# Patient Record
Sex: Female | Born: 1965
Health system: Southern US, Community
[De-identification: ages and names within clinical notes are randomized; demographics above are authoritative.]

## PROBLEM LIST (undated history)

## (undated) DIAGNOSIS — R112 Nausea with vomiting, unspecified: Secondary | ICD-10-CM

## (undated) DIAGNOSIS — F419 Anxiety disorder, unspecified: Secondary | ICD-10-CM

## (undated) DIAGNOSIS — Z9889 Other specified postprocedural states: Secondary | ICD-10-CM

## (undated) DIAGNOSIS — N62 Hypertrophy of breast: Secondary | ICD-10-CM

## (undated) DIAGNOSIS — R51 Headache: Secondary | ICD-10-CM

## (undated) DIAGNOSIS — R87629 Unspecified abnormal cytological findings in specimens from vagina: Secondary | ICD-10-CM

## (undated) DIAGNOSIS — R0981 Nasal congestion: Secondary | ICD-10-CM

## (undated) DIAGNOSIS — J302 Other seasonal allergic rhinitis: Secondary | ICD-10-CM

## (undated) DIAGNOSIS — Z98811 Dental restoration status: Secondary | ICD-10-CM

## (undated) HISTORY — PX: APPENDECTOMY: SHX54

## (undated) HISTORY — DX: Unspecified abnormal cytological findings in specimens from vagina: R87.629

## (undated) HISTORY — DX: Other seasonal allergic rhinitis: J30.2

## (undated) HISTORY — PX: CHOLECYSTECTOMY: SHX55

## (undated) HISTORY — PX: ABDOMINAL HYSTERECTOMY: SHX81

## (undated) HISTORY — PX: REDUCTION MAMMAPLASTY: SUR839

---

## 2003-08-06 ENCOUNTER — Ambulatory Visit (HOSPITAL_COMMUNITY): Admission: RE | Admit: 2003-08-06 | Discharge: 2003-08-06 | Payer: Self-pay | Admitting: Family Medicine

## 2003-12-19 ENCOUNTER — Ambulatory Visit (HOSPITAL_COMMUNITY): Admission: RE | Admit: 2003-12-19 | Discharge: 2003-12-19 | Payer: Self-pay | Admitting: *Deleted

## 2004-01-08 ENCOUNTER — Encounter (HOSPITAL_COMMUNITY): Admission: RE | Admit: 2004-01-08 | Discharge: 2004-02-07 | Payer: Self-pay | Admitting: Family Medicine

## 2004-01-12 ENCOUNTER — Encounter (HOSPITAL_COMMUNITY): Admission: RE | Admit: 2004-01-12 | Discharge: 2004-02-11 | Payer: Self-pay | Admitting: Family Medicine

## 2004-02-12 ENCOUNTER — Encounter (HOSPITAL_COMMUNITY): Admission: RE | Admit: 2004-02-12 | Discharge: 2004-03-13 | Payer: Self-pay | Admitting: Family Medicine

## 2004-03-16 ENCOUNTER — Encounter (HOSPITAL_COMMUNITY): Admission: RE | Admit: 2004-03-16 | Discharge: 2004-04-09 | Payer: Self-pay | Admitting: Family Medicine

## 2005-06-09 ENCOUNTER — Ambulatory Visit (HOSPITAL_COMMUNITY): Admission: RE | Admit: 2005-06-09 | Discharge: 2005-06-09 | Payer: Self-pay | Admitting: Family Medicine

## 2005-12-21 ENCOUNTER — Ambulatory Visit (HOSPITAL_COMMUNITY): Admission: RE | Admit: 2005-12-21 | Discharge: 2005-12-21 | Payer: Self-pay | Admitting: Family Medicine

## 2006-03-01 ENCOUNTER — Ambulatory Visit (HOSPITAL_COMMUNITY): Admission: RE | Admit: 2006-03-01 | Discharge: 2006-03-01 | Payer: Self-pay | Admitting: Family Medicine

## 2006-05-05 ENCOUNTER — Encounter: Admission: RE | Admit: 2006-05-05 | Discharge: 2006-05-05 | Payer: Self-pay | Admitting: Neurosurgery

## 2010-04-13 ENCOUNTER — Ambulatory Visit (HOSPITAL_COMMUNITY)
Admission: RE | Admit: 2010-04-13 | Discharge: 2010-04-13 | Payer: Self-pay | Source: Home / Self Care | Attending: Family Medicine | Admitting: Family Medicine

## 2010-04-21 ENCOUNTER — Ambulatory Visit (HOSPITAL_COMMUNITY)
Admission: RE | Admit: 2010-04-21 | Discharge: 2010-04-21 | Payer: Self-pay | Source: Home / Self Care | Attending: Family Medicine | Admitting: Family Medicine

## 2010-05-11 ENCOUNTER — Other Ambulatory Visit (HOSPITAL_COMMUNITY): Payer: Self-pay | Admitting: Family Medicine

## 2010-05-11 DIAGNOSIS — N852 Hypertrophy of uterus: Secondary | ICD-10-CM

## 2010-05-13 ENCOUNTER — Other Ambulatory Visit (HOSPITAL_COMMUNITY): Payer: Self-pay | Admitting: Family Medicine

## 2010-05-13 ENCOUNTER — Ambulatory Visit (HOSPITAL_COMMUNITY)
Admission: RE | Admit: 2010-05-13 | Discharge: 2010-05-13 | Disposition: A | Discharge status: Home / Self Care | Payer: BC Managed Care – PPO | Source: Ambulatory Visit | Attending: Family Medicine | Admitting: Family Medicine

## 2010-05-13 DIAGNOSIS — R9389 Abnormal findings on diagnostic imaging of other specified body structures: Secondary | ICD-10-CM | POA: Insufficient documentation

## 2010-05-13 DIAGNOSIS — Z09 Encounter for follow-up examination after completed treatment for conditions other than malignant neoplasm: Secondary | ICD-10-CM

## 2010-05-13 DIAGNOSIS — R109 Unspecified abdominal pain: Secondary | ICD-10-CM | POA: Insufficient documentation

## 2010-05-13 DIAGNOSIS — N852 Hypertrophy of uterus: Secondary | ICD-10-CM

## 2010-10-20 ENCOUNTER — Ambulatory Visit (HOSPITAL_COMMUNITY): Payer: BC Managed Care – PPO

## 2011-05-04 ENCOUNTER — Ambulatory Visit (HOSPITAL_COMMUNITY)
Admission: RE | Admit: 2011-05-04 | Discharge: 2011-05-04 | Disposition: A | Discharge status: Home / Self Care | Payer: Managed Care, Other (non HMO) | Source: Ambulatory Visit | Attending: Family Medicine | Admitting: Family Medicine

## 2011-05-04 DIAGNOSIS — Z09 Encounter for follow-up examination after completed treatment for conditions other than malignant neoplasm: Secondary | ICD-10-CM

## 2011-05-04 DIAGNOSIS — R928 Other abnormal and inconclusive findings on diagnostic imaging of breast: Secondary | ICD-10-CM | POA: Insufficient documentation

## 2012-03-11 DIAGNOSIS — N62 Hypertrophy of breast: Secondary | ICD-10-CM

## 2012-03-11 HISTORY — DX: Hypertrophy of breast: N62

## 2012-04-02 ENCOUNTER — Encounter (HOSPITAL_BASED_OUTPATIENT_CLINIC_OR_DEPARTMENT_OTHER): Payer: Self-pay | Admitting: *Deleted

## 2012-04-02 DIAGNOSIS — R0981 Nasal congestion: Secondary | ICD-10-CM

## 2012-04-02 HISTORY — DX: Nasal congestion: R09.81

## 2012-04-09 ENCOUNTER — Encounter (HOSPITAL_BASED_OUTPATIENT_CLINIC_OR_DEPARTMENT_OTHER): Payer: Self-pay | Admitting: *Deleted

## 2012-04-09 ENCOUNTER — Encounter (HOSPITAL_BASED_OUTPATIENT_CLINIC_OR_DEPARTMENT_OTHER): Payer: Self-pay | Admitting: Anesthesiology

## 2012-04-09 ENCOUNTER — Ambulatory Visit (HOSPITAL_BASED_OUTPATIENT_CLINIC_OR_DEPARTMENT_OTHER): Payer: Managed Care, Other (non HMO) | Admitting: Anesthesiology

## 2012-04-09 ENCOUNTER — Ambulatory Visit (HOSPITAL_BASED_OUTPATIENT_CLINIC_OR_DEPARTMENT_OTHER)
Admission: RE | Admit: 2012-04-09 | Discharge: 2012-04-10 | Disposition: A | Discharge status: Home / Self Care | Payer: Managed Care, Other (non HMO) | Source: Ambulatory Visit | Attending: Specialist | Admitting: Specialist

## 2012-04-09 ENCOUNTER — Encounter (HOSPITAL_BASED_OUTPATIENT_CLINIC_OR_DEPARTMENT_OTHER)
Admission: RE | Disposition: A | Discharge status: Home / Self Care | Payer: Self-pay | Source: Ambulatory Visit | Attending: Specialist

## 2012-04-09 DIAGNOSIS — F411 Generalized anxiety disorder: Secondary | ICD-10-CM | POA: Insufficient documentation

## 2012-04-09 DIAGNOSIS — N62 Hypertrophy of breast: Secondary | ICD-10-CM | POA: Insufficient documentation

## 2012-04-09 DIAGNOSIS — Z9089 Acquired absence of other organs: Secondary | ICD-10-CM | POA: Insufficient documentation

## 2012-04-09 HISTORY — DX: Nasal congestion: R09.81

## 2012-04-09 HISTORY — DX: Anxiety disorder, unspecified: F41.9

## 2012-04-09 HISTORY — DX: Headache: R51

## 2012-04-09 HISTORY — DX: Nausea with vomiting, unspecified: Z98.890

## 2012-04-09 HISTORY — DX: Hypertrophy of breast: N62

## 2012-04-09 HISTORY — DX: Other specified postprocedural states: R11.2

## 2012-04-09 HISTORY — DX: Dental restoration status: Z98.811

## 2012-04-09 HISTORY — PX: BREAST REDUCTION SURGERY: SHX8

## 2012-04-09 SURGERY — MAMMOPLASTY, REDUCTION
Anesthesia: General | Site: Breast | Laterality: Bilateral | Wound class: Clean

## 2012-04-09 MED ORDER — OXYCODONE HCL 5 MG PO TABS: 5.0000 mg | ORAL_TABLET | Freq: Once | ORAL | Status: AC | PRN

## 2012-04-09 MED ORDER — PROMETHAZINE HCL 25 MG/ML IJ SOLN: 6.2500 mg | INTRAMUSCULAR | Status: DC | PRN

## 2012-04-09 MED ORDER — DIPHENHYDRAMINE HCL 50 MG/ML IJ SOLN: 12.5000 mg | Freq: Four times a day (QID) | INTRAMUSCULAR | Status: DC | PRN

## 2012-04-09 MED ORDER — PROPOFOL 10 MG/ML IV BOLUS
INTRAVENOUS | Status: DC | PRN
  Administered 2012-04-09: 200 mg via INTRAVENOUS

## 2012-04-09 MED ORDER — LACTATED RINGERS IV SOLN
INTRAVENOUS | Status: DC
  Administered 2012-04-09 (×2): via INTRAVENOUS

## 2012-04-09 MED ORDER — LIDOCAINE-EPINEPHRINE 0.5 %-1:200000 IJ SOLN
INTRAMUSCULAR | Status: DC | PRN
  Administered 2012-04-09: 50 mL

## 2012-04-09 MED ORDER — HYDROCODONE-ACETAMINOPHEN 5-325 MG PO TABS
1.0000 | ORAL_TABLET | ORAL | Status: DC | PRN
  Administered 2012-04-09 – 2012-04-10 (×4): 2 via ORAL

## 2012-04-09 MED ORDER — MORPHINE SULFATE (PF) 1 MG/ML IV SOLN
INTRAVENOUS | Status: DC
  Administered 2012-04-09: 1 mg via INTRAVENOUS

## 2012-04-09 MED ORDER — SUCCINYLCHOLINE CHLORIDE 20 MG/ML IJ SOLN
INTRAMUSCULAR | Status: DC | PRN
  Administered 2012-04-09: 100 mg via INTRAVENOUS

## 2012-04-09 MED ORDER — CEFAZOLIN SODIUM 1-5 GM-% IV SOLN
1.0000 g | Freq: Three times a day (TID) | INTRAVENOUS | Status: DC
  Administered 2012-04-09 (×2): 1 g via INTRAVENOUS

## 2012-04-09 MED ORDER — MEPERIDINE HCL 25 MG/ML IJ SOLN: 6.2500 mg | INTRAMUSCULAR | Status: DC | PRN

## 2012-04-09 MED ORDER — FENTANYL CITRATE 0.05 MG/ML IJ SOLN
INTRAMUSCULAR | Status: DC | PRN
  Administered 2012-04-09: 25 ug via INTRAVENOUS
  Administered 2012-04-09: 100 ug via INTRAVENOUS

## 2012-04-09 MED ORDER — CEFAZOLIN SODIUM-DEXTROSE 2-3 GM-% IV SOLR
2.0000 g | INTRAVENOUS | Status: AC
  Administered 2012-04-09: 2 g via INTRAVENOUS

## 2012-04-09 MED ORDER — ONDANSETRON HCL 4 MG/2ML IJ SOLN: 4.0000 mg | Freq: Four times a day (QID) | INTRAMUSCULAR | Status: DC | PRN

## 2012-04-09 MED ORDER — FENTANYL CITRATE 0.05 MG/ML IJ SOLN: 50.0000 ug | INTRAMUSCULAR | Status: DC | PRN

## 2012-04-09 MED ORDER — SODIUM CHLORIDE 0.9 % IJ SOLN: 9.0000 mL | INTRAMUSCULAR | Status: DC | PRN

## 2012-04-09 MED ORDER — DIPHENHYDRAMINE HCL 12.5 MG/5ML PO ELIX: 12.5000 mg | ORAL_SOLUTION | Freq: Four times a day (QID) | ORAL | Status: DC | PRN

## 2012-04-09 MED ORDER — MORPHINE SULFATE 2 MG/ML IJ SOLN
2.0000 mg | INTRAMUSCULAR | Status: DC | PRN
  Administered 2012-04-09: 2 mg via INTRAVENOUS

## 2012-04-09 MED ORDER — LIDOCAINE-EPINEPHRINE 0.5 %-1:200000 IJ SOLN
INTRAMUSCULAR | Status: DC | PRN
  Administered 2012-04-09: 250 mL
  Administered 2012-04-09: 50 mL

## 2012-04-09 MED ORDER — ONDANSETRON HCL 4 MG PO TABS: 4.0000 mg | ORAL_TABLET | Freq: Four times a day (QID) | ORAL | Status: DC | PRN

## 2012-04-09 MED ORDER — MIDAZOLAM HCL 5 MG/5ML IJ SOLN
INTRAMUSCULAR | Status: DC | PRN
  Administered 2012-04-09: 2 mg via INTRAVENOUS

## 2012-04-09 MED ORDER — MIDAZOLAM HCL 2 MG/2ML IJ SOLN: 1.0000 mg | INTRAMUSCULAR | Status: DC | PRN

## 2012-04-09 MED ORDER — DEXTROSE IN LACTATED RINGERS 5 % IV SOLN
INTRAVENOUS | Status: DC
  Administered 2012-04-09: 125 mL/h via INTRAVENOUS
  Administered 2012-04-09 – 2012-04-10 (×2): via INTRAVENOUS

## 2012-04-09 MED ORDER — HYDROMORPHONE HCL PF 1 MG/ML IJ SOLN
0.2500 mg | INTRAMUSCULAR | Status: DC | PRN
  Administered 2012-04-09 (×3): 0.5 mg via INTRAVENOUS

## 2012-04-09 MED ORDER — ONDANSETRON HCL 4 MG/2ML IJ SOLN
INTRAMUSCULAR | Status: DC | PRN
  Administered 2012-04-09: 4 mg via INTRAVENOUS

## 2012-04-09 MED ORDER — SODIUM CHLORIDE 0.9 % IV SOLN
INTRAVENOUS | Status: DC | PRN
  Administered 2012-04-09: 250 mL via INTRAMUSCULAR

## 2012-04-09 MED ORDER — OXYCODONE HCL 5 MG/5ML PO SOLN: 5.0000 mg | Freq: Once | ORAL | Status: AC | PRN

## 2012-04-09 MED ORDER — LIDOCAINE HCL (CARDIAC) 20 MG/ML IV SOLN
INTRAVENOUS | Status: DC | PRN
  Administered 2012-04-09: 50 mg via INTRAVENOUS

## 2012-04-09 MED ORDER — MIDAZOLAM HCL 2 MG/2ML IJ SOLN: 0.5000 mg | Freq: Once | INTRAMUSCULAR | Status: AC | PRN

## 2012-04-09 MED ORDER — DEXAMETHASONE SODIUM PHOSPHATE 4 MG/ML IJ SOLN
INTRAMUSCULAR | Status: DC | PRN
  Administered 2012-04-09: 10 mg via INTRAVENOUS

## 2012-04-09 MED ORDER — NALOXONE HCL 0.4 MG/ML IJ SOLN: 0.4000 mg | INTRAMUSCULAR | Status: DC | PRN

## 2012-04-09 SURGICAL SUPPLY — 56 items
BAG DECANTER FOR FLEXI CONT (MISCELLANEOUS) ×2 IMPLANT
BENZOIN TINCTURE PRP APPL 2/3 (GAUZE/BANDAGES/DRESSINGS) ×4 IMPLANT
BLADE KNIFE  20 PERSONNA (BLADE) ×2
BLADE KNIFE 20 PERSONNA (BLADE) ×2 IMPLANT
BLADE KNIFE PERSONA 15 (BLADE) ×2 IMPLANT
CANISTER SUCTION 1200CC (MISCELLANEOUS) ×2 IMPLANT
CLOTH BEACON ORANGE TIMEOUT ST (SAFETY) ×2 IMPLANT
COVER MAYO STAND STRL (DRAPES) ×2 IMPLANT
COVER TABLE BACK 60X90 (DRAPES) ×2 IMPLANT
DECANTER SPIKE VIAL GLASS SM (MISCELLANEOUS) ×4 IMPLANT
DRAIN CHANNEL 10F 3/8 F FF (DRAIN) ×4 IMPLANT
DRAPE LAPAROSCOPIC ABDOMINAL (DRAPES) ×2 IMPLANT
DRAPE UTILITY XL STRL (DRAPES) ×2 IMPLANT
DRSG PAD ABDOMINAL 8X10 ST (GAUZE/BANDAGES/DRESSINGS) ×8 IMPLANT
ELECT REM PT RETURN 9FT ADLT (ELECTROSURGICAL) ×2
ELECTRODE REM PT RTRN 9FT ADLT (ELECTROSURGICAL) ×1 IMPLANT
EVACUATOR SILICONE 100CC (DRAIN) ×4 IMPLANT
FILTER 7/8 IN (FILTER) ×2 IMPLANT
GAUZE XEROFORM 5X9 LF (GAUZE/BANDAGES/DRESSINGS) ×4 IMPLANT
GLOVE BIO SURGEON STRL SZ 6.5 (GLOVE) ×2 IMPLANT
GLOVE BIOGEL M STRL SZ7.5 (GLOVE) ×2 IMPLANT
GLOVE BIOGEL PI IND STRL 8 (GLOVE) ×1 IMPLANT
GLOVE BIOGEL PI INDICATOR 8 (GLOVE) ×1
GLOVE ECLIPSE 7.0 STRL STRAW (GLOVE) ×2 IMPLANT
GOWN PREVENTION PLUS XXLARGE (GOWN DISPOSABLE) ×4 IMPLANT
IV NS 500ML (IV SOLUTION) ×1
IV NS 500ML BAXH (IV SOLUTION) ×1 IMPLANT
NEEDLE SPNL 18GX3.5 QUINCKE PK (NEEDLE) ×2 IMPLANT
NS IRRIG 1000ML POUR BTL (IV SOLUTION) ×2 IMPLANT
PACK BASIN DAY SURGERY FS (CUSTOM PROCEDURE TRAY) ×2 IMPLANT
PEN SKIN MARKING BROAD TIP (MISCELLANEOUS) ×2 IMPLANT
PILLOW FOAM RUBBER ADULT (PILLOWS) ×2 IMPLANT
PIN SAFETY STERILE (MISCELLANEOUS) ×2 IMPLANT
SHEETING SILICONE GEL EPI DERM (MISCELLANEOUS) IMPLANT
SLEEVE SCD COMPRESS KNEE MED (MISCELLANEOUS) ×2 IMPLANT
SPECIMEN JAR MEDIUM (MISCELLANEOUS) IMPLANT
SPECIMEN JAR X LARGE (MISCELLANEOUS) ×4 IMPLANT
SPONGE GAUZE 4X4 12PLY (GAUZE/BANDAGES/DRESSINGS) ×4 IMPLANT
SPONGE LAP 18X18 X RAY DECT (DISPOSABLE) ×8 IMPLANT
STRIP SUTURE WOUND CLOSURE 1/2 (SUTURE) ×10 IMPLANT
SUT MNCRL AB 3-0 PS2 18 (SUTURE) ×12 IMPLANT
SUT MON AB 2-0 CT1 36 (SUTURE) IMPLANT
SUT MON AB 5-0 PS2 18 (SUTURE) ×4 IMPLANT
SUT PROLENE 3 0 PS 2 (SUTURE) ×12 IMPLANT
SYR 50ML LL SCALE MARK (SYRINGE) ×4 IMPLANT
SYR CONTROL 10ML LL (SYRINGE) IMPLANT
TAPE HYPAFIX 6X30 (GAUZE/BANDAGES/DRESSINGS) ×2 IMPLANT
TAPE MEASURE 72IN RETRACT (INSTRUMENTS) ×1
TAPE MEASURE LINEN 72IN RETRCT (INSTRUMENTS) ×1 IMPLANT
TAPE PAPER MEDFIX 1IN X 10YD (GAUZE/BANDAGES/DRESSINGS) ×2 IMPLANT
TOWEL OR NON WOVEN STRL DISP B (DISPOSABLE) ×2 IMPLANT
TUBE CONNECTING 20X1/4 (TUBING) ×2 IMPLANT
UNDERPAD 30X30 INCONTINENT (UNDERPADS AND DIAPERS) ×6 IMPLANT
VAC PENCILS W/TUBING CLEAR (MISCELLANEOUS) ×2 IMPLANT
WATER STERILE IRR 1000ML POUR (IV SOLUTION) IMPLANT
YANKAUER SUCT BULB TIP NO VENT (SUCTIONS) ×2 IMPLANT

## 2012-04-09 NOTE — Anesthesia Preprocedure Evaluation (Signed)
Anesthesia Evaluation  Patient identified by MRN, date of birth, ID band Patient awake    Reviewed: Allergy & Precautions, H&P , NPO status , Patient's Chart, lab work & pertinent test results  History of Anesthesia Complications Negative for: history of anesthetic complications  Airway Mallampati: I TM Distance: >3 FB Neck ROM: Full    Dental No notable dental hx. (+) Teeth Intact and Dental Advisory Given   Pulmonary neg pulmonary ROS,  breath sounds clear to auscultation  Pulmonary exam normal       Cardiovascular negative cardio ROS  Rhythm:Regular Rate:Normal     Neuro/Psych negative neurological ROS     GI/Hepatic negative GI ROS, Neg liver ROS,   Endo/Other  negative endocrine ROS  Renal/GU negative Renal ROS     Musculoskeletal   Abdominal   Peds  Hematology negative hematology ROS (+)   Anesthesia Other Findings   Reproductive/Obstetrics LMP 2-3 weeks ago                           Anesthesia Physical Anesthesia Plan  ASA: I  Anesthesia Plan: General   Post-op Pain Management:    Induction: Intravenous  Airway Management Planned: Oral ETT  Additional Equipment:   Intra-op Plan:   Post-operative Plan: Extubation in OR  Informed Consent: I have reviewed the patients History and Physical, chart, labs and discussed the procedure including the risks, benefits and alternatives for the proposed anesthesia with the patient or authorized representative who has indicated his/her understanding and acceptance.   Dental advisory given  Plan Discussed with: CRNA and Surgeon  Anesthesia Plan Comments: (Plan routine monitors, GETA)        Anesthesia Quick Evaluation

## 2012-04-09 NOTE — Transfer of Care (Signed)
Immediate Anesthesia Transfer of Care Note  Patient: Rebekah Andrews  Procedure(s) Performed: Procedure(s) (LRB) with comments: MAMMARY REDUCTION  (BREAST) (Bilateral)  Patient Location: PACU  Anesthesia Type:General  Level of Consciousness: awake and alert   Airway & Oxygen Therapy: Patient Spontanous Breathing and Patient connected to face mask oxygen  Post-op Assessment: Report given to PACU RN and Post -op Vital signs reviewed and stable  Post vital signs: Reviewed and stable  Complications: No apparent anesthesia complications

## 2012-04-09 NOTE — H&P (Signed)
Rebekah Andrews is an 46 y.o. female.   Chief Complaint: Increased macromastia HPI: history of back and shoulder pain intertrigo  Past Medical History  Diagnosis Date  . PONV (postoperative nausea and vomiting)   . Headache     due to pressure from macromastia  . Anxiety   . Sinus congestion 04/02/2012  . Dental crown present   . Breast hypertrophy 03/2012    Past Surgical History  Procedure Date  . Cesarean section     x 2  . Cholecystectomy   . Appendectomy     History reviewed. No pertinent family history. Social History:  reports that she has never smoked. She has never used smokeless tobacco. She reports that she drinks alcohol. She reports that she does not use illicit drugs.  Allergies: No Known Allergies  Medications Prior to Admission  Medication Sig Dispense Refill  . buPROPion (WELLBUTRIN) 75 MG tablet Take 75 mg by mouth 2 (two) times daily.      . clonazePAM (KLONOPIN) 0.5 MG tablet Take 0.5 mg by mouth 2 (two) times daily as needed.      . zolpidem (AMBIEN CR) 6.25 MG CR tablet Take 6.25 mg by mouth at bedtime as needed.        Results for orders placed during the hospital encounter of 04/09/12 (from the past 48 hour(s))  POCT HEMOGLOBIN-HEMACUE     Status: Abnormal   Collection Time   04/09/12  7:19 AM      Component Value Range Comment   Hemoglobin 11.7 (*) 12.0 - 15.0 g/dL    No results found.  Review of Systems  Constitutional: Negative.   HENT: Negative.   Eyes: Negative.   Respiratory: Negative.   Cardiovascular: Negative.   Gastrointestinal: Negative.   Genitourinary: Negative.   Musculoskeletal: Negative.   Skin: Positive for rash.  Neurological: Negative.   Endo/Heme/Allergies: Negative.   Psychiatric/Behavioral: Negative.     Blood pressure 103/68, pulse 63, temperature 98.3 F (36.8 C), temperature source Oral, resp. rate 16, height 5\' 7"  (1.702 m), weight 57.607 kg (127 lb), last menstrual period 03/22/2012, SpO2 100.00%. Physical  Exam   Assessment/Plan Macromastia for bilateral breast reductions  Chalsey Leeth L 04/09/2012, 7:33 AM

## 2012-04-09 NOTE — Brief Op Note (Signed)
04/09/2012  9:52 AM  PATIENT:  Rebekah Andrews  46 y.o. female  PRE-OPERATIVE DIAGNOSIS:  MACROMASTIA  POST-OPERATIVE DIAGNOSIS:  MACROMASTIA  PROCEDURE:  Procedure(s) (LRB) with comments: MAMMARY REDUCTION  (BREAST) (Bilateral)  SURGEON:  Surgeon(s) and Role:    * Louisa Second, MD - Primary  PHYSICIAN ASSISTANT:   ASSISTANTS: none   ANESTHESIA:   general  EBL:  Total I/O In: 1500 [I.V.:1500] Out: -   BLOOD ADMINISTERED:none  DRAINS: (right and left chest areas) Jackson-Pratt drain(s) with closed bulb suction in the right and left chest areas   LOCAL MEDICATIONS USED:  XYLOCAINE   SPECIMEN:  Excision  DISPOSITION OF SPECIMEN:  PATHOLOGY  COUNTS:  YES  TOURNIQUET:  * No tourniquets in log *  DICTATION: .Other Dictation: Dictation Number [id number]  PLAN OF CARE: Admit for overnight observation  PATIENT DISPOSITION:  PACU - hemodynamically stable.   Delay start of Pharmacological VTE agent (>24hrs) due to surgical blood loss or risk of bleeding: yes

## 2012-04-09 NOTE — Anesthesia Postprocedure Evaluation (Signed)
  Anesthesia Post-op Note  Patient: Rebekah Andrews  Procedure(s) Performed: Procedure(s) (LRB) with comments: MAMMARY REDUCTION  (BREAST) (Bilateral)  Patient Location: PACU  Anesthesia Type:General  Level of Consciousness: awake, alert , oriented and patient cooperative  Airway and Oxygen Therapy: Patient Spontanous Breathing  Post-op Pain: none  Post-op Assessment: Post-op Vital signs reviewed, Patient's Cardiovascular Status Stable, Respiratory Function Stable, Patent Airway, No signs of Nausea or vomiting and Pain level controlled  Post-op Vital Signs: Reviewed and stable  Complications: No apparent anesthesia complications

## 2012-04-09 NOTE — Anesthesia Procedure Notes (Signed)
Procedure Name: Intubation Date/Time: 04/09/2012 7:53 AM Performed by: Caren Macadam Pre-anesthesia Checklist: Patient identified, Emergency Drugs available, Suction available and Patient being monitored Patient Re-evaluated:Patient Re-evaluated prior to inductionOxygen Delivery Method: Circle System Utilized Preoxygenation: Pre-oxygenation with 100% oxygen Intubation Type: IV induction Ventilation: Mask ventilation without difficulty Laryngoscope Size: Miller and 2 Grade View: Grade I Tube type: Oral Tube size: 7.0 mm Number of attempts: 1 Airway Equipment and Method: stylet and oral airway Placement Confirmation: ETT inserted through vocal cords under direct vision,  positive ETCO2 and breath sounds checked- equal and bilateral Secured at: 19 cm Tube secured with: Tape Dental Injury: Teeth and Oropharynx as per pre-operative assessment

## 2012-04-10 ENCOUNTER — Encounter (HOSPITAL_BASED_OUTPATIENT_CLINIC_OR_DEPARTMENT_OTHER): Payer: Self-pay | Admitting: Specialist

## 2012-04-10 NOTE — Op Note (Signed)
[name], [name]               ACCOUNT NO.:  [id number]  MEDICAL RECORD NO.:  [id number]  LOCATION:                                 FACILITY:  PHYSICIAN:  Earvin Hansen L. Julion Gatt, M.D.DATE OF BIRTH:  1965/08/25  DATE OF PROCEDURE:  04/09/2012 DATE OF DISCHARGE:                              OPERATIVE REPORT   A 46 year old lady with severe macromastia, back and shoulder pain secondary to large pendulous breasts, has various small frame chest area with increased pendulous heavy breasts resistant to conservative treatment.  PROCEDURES PLANNED:  Bilateral breast reductions using the inferior pedicle technique.  ANESTHESIA:  General.  Preoperatively, the patient was self indulgent for the reduction mammoplasty, remarking the nipple-areolar complexes up to 21 cm from the suprasternal notch.  She underwent general anesthesia, intubated orally without difficulty.  Prep was done to the chest, breast areas in routine fashion using Hibiclens soap and solution.  Sterile towels and drapes were placed to make a sterile field.  The skin incisions and the breast tissues were injected with local anesthesia, 100,000 to 400,000 concentration, total of 200 mL per side.  Xylocaine with epinephrine. The wounds were scored with #15-blade and the skin of the inferior pedicle was de-epithelialized with #20 blade.  Medial and lateral pedicles were excised down the fascia.  New keyhole area was also debulked.  After proper hemostasis, excess accessory breast tissue was also removed laterally.  The flaps were then transposed and stayed with 3-0 Prolene.  Subcutaneous closure was done with 3-0 Monocryl x2 layers, a running subcuticular stitch of 3-0 Monocryl, and 5-0 Monocryl throughout the inverted T.  The wounds had excellent symmetry at the end, the left side was slightly smaller.  The drains were placed on the lateral portion of the incisions and brought out though the lateralmost portion and secured  with 3-0 Prolene.  Jackson-Pratt drains, #10 fluted fully.  The wounds were cleansed.  Steri-Strips and soft dressing were applied.  Xeroform, 4x4s, ABDs, Hypafix tape.  The nipple-areolar complex were examined with excellent symmetry and color.  She was then taken to recovery in excellent condition.  ESTIMATED BLOOD LOSS:  Less than 150 mL.  COMPLICATIONS:  None.     Rebekah Andrews. Rebekah Andrews, M.D.     Rebekah Andrews  D:  04/09/2012  T:  04/09/2012  Job:  [id number]

## 2012-07-16 ENCOUNTER — Other Ambulatory Visit (HOSPITAL_COMMUNITY): Payer: Self-pay | Admitting: Family Medicine

## 2012-07-23 ENCOUNTER — Other Ambulatory Visit (HOSPITAL_COMMUNITY): Payer: Self-pay | Admitting: Family Medicine

## 2012-07-23 NOTE — Telephone Encounter (Signed)
RX phoned in on pharmacy voicemail 

## 2012-07-23 NOTE — Telephone Encounter (Signed)
Refill on clonapin, 1 mg. Last seen 12/26/11

## 2012-07-23 NOTE — Telephone Encounter (Signed)
Refill this plus 1 additional. Will need ov before more

## 2012-10-02 ENCOUNTER — Other Ambulatory Visit (HOSPITAL_COMMUNITY): Payer: Self-pay | Admitting: Family Medicine

## 2012-10-03 ENCOUNTER — Telehealth: Payer: Self-pay | Admitting: *Deleted

## 2012-10-03 NOTE — Telephone Encounter (Signed)
Ok times three 

## 2012-10-03 NOTE — Telephone Encounter (Signed)
Left message to pt of medication called into pharmacy

## 2012-10-03 NOTE — Telephone Encounter (Signed)
Medication called in to pharmacy.

## 2012-10-08 ENCOUNTER — Encounter: Payer: Self-pay | Admitting: *Deleted

## 2012-11-19 ENCOUNTER — Other Ambulatory Visit (HOSPITAL_COMMUNITY): Payer: Self-pay | Admitting: Family Medicine

## 2013-01-19 ENCOUNTER — Other Ambulatory Visit (HOSPITAL_COMMUNITY): Payer: Self-pay | Admitting: Family Medicine

## 2013-01-21 NOTE — Telephone Encounter (Signed)
Not seen since epic

## 2013-01-21 NOTE — Telephone Encounter (Signed)
3 refills  Each, then will need OV before further

## 2013-02-19 ENCOUNTER — Telehealth: Payer: Self-pay | Admitting: Family Medicine

## 2013-02-19 NOTE — Telephone Encounter (Signed)
Patient states she has an on going issues with Bladder Infections and would like to know if Dr. Lorin Picket would phone something in to the Pharmacy for these symptoms?  [facility] [place]

## 2013-04-28 ENCOUNTER — Other Ambulatory Visit (HOSPITAL_COMMUNITY): Payer: Self-pay | Admitting: Family Medicine

## 2013-05-26 ENCOUNTER — Other Ambulatory Visit (HOSPITAL_COMMUNITY): Payer: Self-pay | Admitting: Family Medicine

## 2013-06-27 ENCOUNTER — Other Ambulatory Visit (HOSPITAL_COMMUNITY): Payer: Self-pay | Admitting: Family Medicine

## 2013-07-17 ENCOUNTER — Other Ambulatory Visit (HOSPITAL_COMMUNITY): Payer: Self-pay | Admitting: Family Medicine

## 2013-07-30 ENCOUNTER — Other Ambulatory Visit (HOSPITAL_COMMUNITY): Payer: Self-pay | Admitting: Family Medicine

## 2013-07-30 NOTE — Telephone Encounter (Signed)
Card mailed 

## 2013-07-30 NOTE — Telephone Encounter (Signed)
May refill x1. Please send a cart or a letter to the patient letting her know this is her last refill until she can come in and be seen.

## 2013-08-13 ENCOUNTER — Encounter: Payer: Managed Care, Other (non HMO) | Admitting: Family Medicine

## 2013-08-16 ENCOUNTER — Other Ambulatory Visit (HOSPITAL_COMMUNITY): Payer: Self-pay | Admitting: Family Medicine

## 2013-08-16 ENCOUNTER — Other Ambulatory Visit: Payer: Self-pay | Admitting: Nurse Practitioner

## 2013-08-19 NOTE — Telephone Encounter (Signed)
Has not been seen in over a year. Was a No Show on 08/13/13

## 2013-08-19 NOTE — Telephone Encounter (Signed)
One month no refills send card need to be seen

## 2013-08-26 ENCOUNTER — Ambulatory Visit (INDEPENDENT_AMBULATORY_CARE_PROVIDER_SITE_OTHER): Payer: Managed Care, Other (non HMO) | Admitting: Nurse Practitioner

## 2013-08-26 VITALS — BP 112/80 | Ht 66.5 in | Wt <= 1120 oz

## 2013-08-26 DIAGNOSIS — R5381 Other malaise: Secondary | ICD-10-CM

## 2013-08-26 DIAGNOSIS — Z0189 Encounter for other specified special examinations: Secondary | ICD-10-CM

## 2013-08-26 DIAGNOSIS — N951 Menopausal and female climacteric states: Secondary | ICD-10-CM

## 2013-08-26 DIAGNOSIS — F411 Generalized anxiety disorder: Secondary | ICD-10-CM

## 2013-08-26 DIAGNOSIS — R5383 Other fatigue: Secondary | ICD-10-CM

## 2013-08-26 DIAGNOSIS — G4700 Insomnia, unspecified: Secondary | ICD-10-CM

## 2013-08-26 DIAGNOSIS — Z79899 Other long term (current) drug therapy: Secondary | ICD-10-CM

## 2013-08-26 DIAGNOSIS — F419 Anxiety disorder, unspecified: Secondary | ICD-10-CM

## 2013-08-26 DIAGNOSIS — Z1231 Encounter for screening mammogram for malignant neoplasm of breast: Secondary | ICD-10-CM

## 2013-08-26 LAB — CBC WITH DIFFERENTIAL/PLATELET
BASOS ABS: 0 10*3/uL (ref 0.0–0.1)
BASOS PCT: 0 % (ref 0–1)
EOS PCT: 1 % (ref 0–5)
Eosinophils Absolute: 0.1 10*3/uL (ref 0.0–0.7)
HEMATOCRIT: 36.7 % (ref 36.0–46.0)
Hemoglobin: 12.4 g/dL (ref 12.0–15.0)
Lymphocytes Relative: 19 % (ref 12–46)
Lymphs Abs: 1.1 10*3/uL (ref 0.7–4.0)
MCH: 32 pg (ref 26.0–34.0)
MCHC: 33.8 g/dL (ref 30.0–36.0)
MCV: 94.8 fL (ref 78.0–100.0)
MONO ABS: 0.3 10*3/uL (ref 0.1–1.0)
MONOS PCT: 6 % (ref 3–12)
Neutro Abs: 4.1 10*3/uL (ref 1.7–7.7)
Neutrophils Relative %: 74 % (ref 43–77)
Platelets: 314 10*3/uL (ref 150–400)
RBC: 3.87 MIL/uL (ref 3.87–5.11)
RDW: 12.7 % (ref 11.5–15.5)
WBC: 5.6 10*3/uL (ref 4.0–10.5)

## 2013-08-26 MED ORDER — ZOLPIDEM TARTRATE ER 12.5 MG PO TBCR: EXTENDED_RELEASE_TABLET | ORAL | Status: DC

## 2013-08-26 MED ORDER — CLONAZEPAM 1 MG PO TABS: ORAL_TABLET | ORAL | Status: DC

## 2013-08-26 MED ORDER — BUPROPION HCL ER (SR) 150 MG PO TB12: ORAL_TABLET | ORAL | Status: DC

## 2013-08-26 MED ORDER — CITALOPRAM HYDROBROMIDE 20 MG PO TABS: 20.0000 mg | ORAL_TABLET | Freq: Every day | ORAL | Status: DC

## 2013-08-26 NOTE — Patient Instructions (Signed)
micronor nexplanon skyla or mirena Depo provera

## 2013-08-27 ENCOUNTER — Encounter: Payer: Self-pay | Admitting: Nurse Practitioner

## 2013-08-27 DIAGNOSIS — G4700 Insomnia, unspecified: Secondary | ICD-10-CM | POA: Insufficient documentation

## 2013-08-27 DIAGNOSIS — N951 Menopausal and female climacteric states: Secondary | ICD-10-CM | POA: Insufficient documentation

## 2013-08-27 DIAGNOSIS — F419 Anxiety disorder, unspecified: Secondary | ICD-10-CM | POA: Insufficient documentation

## 2013-08-27 LAB — HEPATIC FUNCTION PANEL
ALT: 11 U/L (ref 0–35)
AST: 14 U/L (ref 0–37)
Albumin: 3.9 g/dL (ref 3.5–5.2)
Alkaline Phosphatase: 41 U/L (ref 39–117)
BILIRUBIN DIRECT: 0.2 mg/dL (ref 0.0–0.3)
BILIRUBIN INDIRECT: 0.6 mg/dL (ref 0.2–1.2)
BILIRUBIN TOTAL: 0.8 mg/dL (ref 0.2–1.2)
Total Protein: 6.7 g/dL (ref 6.0–8.3)

## 2013-08-27 LAB — LIPID PANEL
CHOLESTEROL: 156 mg/dL (ref 0–200)
HDL: 48 mg/dL (ref >39)
LDL CALC: 92 mg/dL (ref 0–99)
Total CHOL/HDL Ratio: 3.3 Ratio
Triglycerides: 79 mg/dL (ref <150)
VLDL: 16 mg/dL (ref 0–40)

## 2013-08-27 LAB — BASIC METABOLIC PANEL
BUN: 9 mg/dL (ref 6–23)
CHLORIDE: 104 meq/L (ref 96–112)
CO2: 28 meq/L (ref 19–32)
Calcium: 8.9 mg/dL (ref 8.4–10.5)
Creat: 0.71 mg/dL (ref 0.50–1.10)
GLUCOSE: 81 mg/dL (ref 70–99)
POTASSIUM: 4.2 meq/L (ref 3.5–5.3)
Sodium: 140 mEq/L (ref 135–145)

## 2013-08-27 LAB — TSH: TSH: 1.039 u[IU]/mL (ref 0.350–4.500)

## 2013-08-27 LAB — PROGESTERONE: Progesterone: 0.2 ng/mL

## 2013-08-27 LAB — VITAMIN D 25 HYDROXY (VIT D DEFICIENCY, FRACTURES): Vit D, 25-Hydroxy: 48 ng/mL (ref 30–89)

## 2013-08-27 NOTE — Progress Notes (Signed)
Subjective:  Presents for recheck. [facility]. Still having menses. Ambien working well for sleep. Increased anxiety lately, increased stress. wellbutrin does not seem to be enough to control anxiety at this point. Some mood swings.  Objective:   BP 112/80  Ht 5' 6.5" (1.689 m)  Wt 14 lb (6.35 kg)  BMI 2.23 kg/m2 NAD. Alert, oriented. Calm afftect. Lungs clear. Heart RRR.   Assessment:  Problem List Items Addressed This Visit     Other   Anxiety - Primary   Relevant Medications      buPROPion (WELLBUTRIN SR) 12 hr tablet      citalopram (CELEXA) tablet   Insomnia   Perimenopause   Relevant Orders      Estradiol (Completed)      Progesterone (Completed)    Other Visit Diagnoses   Other specified examination        Relevant Orders       Lipid panel (Completed)    Encounter for long-term (current) use of other medications        Relevant Orders       Hepatic function panel (Completed)       Basic metabolic panel (Completed)    Other malaise and fatigue        Relevant Orders       TSH (Completed)       CBC with Differential (Completed)       Vit D  25 hydroxy (rtn osteoporosis monitoring) (Completed)    Other screening mammogram        Relevant Orders       MM DIGITAL SCREENING BILATERAL      Meds ordered this encounter  Medications  . buPROPion (WELLBUTRIN SR) 150 MG 12 hr tablet    Sig: TAKE 1 TABLET BY MOUTH TWICE A DAY (NEEDS OFFICE VISIT FOR FURTHER FILLS)    Dispense:  60 tablet    Refill:  5    Order Specific Question:  Supervising Provider    Answer:  Merlyn AlbertLUKING, WILLIAM S [[id number]]  . zolpidem (AMBIEN CR) 12.5 MG CR tablet    Sig: TAKE 1 TABLET AT BEDTIME AS NEEDED    Dispense:  30 tablet    Refill:  5    *NEEDS OFFICE VISIT*    Order Specific Question:  Supervising Provider    Answer:  Merlyn AlbertLUKING, WILLIAM S [[id number]]  . clonazePAM (KLONOPIN) 1 MG tablet    Sig: TAKE 1/2 TO 1 TABLET TWICE DAILY AS NEEDED    Dispense:  40 tablet     Refill:  5    Order Specific Question:  Supervising Provider    Answer:  Merlyn AlbertLUKING, WILLIAM S [[id number]]  . citalopram (CELEXA) 20 MG tablet    Sig: Take 1 tablet (20 mg total) by mouth daily.    Dispense:  30 tablet    Refill:  5    Patient has mild swelling in hands and feet with Lexapro; no allergy or anaphylaxis    Order Specific Question:  Supervising Provider    Answer:  Merlyn AlbertLUKING, WILLIAM S [[id number]]   Continue current meds. Add Celexa as directed. Stop med and call if any problems. Given info on colonoscopy.  Recheck in 3 months.

## 2013-09-03 ENCOUNTER — Encounter: Payer: Self-pay | Admitting: Nurse Practitioner

## 2013-09-06 LAB — ESTRADIOL, FREE
ESTRADIOL: 22 pg/mL
Estradiol, Free: 0.32 pg/mL

## 2013-09-16 ENCOUNTER — Ambulatory Visit (HOSPITAL_COMMUNITY)
Admission: RE | Admit: 2013-09-16 | Discharge: 2013-09-16 | Disposition: A | Discharge status: Home / Self Care | Payer: Managed Care, Other (non HMO) | Source: Ambulatory Visit | Attending: Nurse Practitioner | Admitting: Nurse Practitioner

## 2013-09-16 DIAGNOSIS — Z1231 Encounter for screening mammogram for malignant neoplasm of breast: Secondary | ICD-10-CM | POA: Insufficient documentation

## 2013-10-02 ENCOUNTER — Other Ambulatory Visit: Payer: Self-pay | Admitting: Family Medicine

## 2013-10-02 NOTE — Telephone Encounter (Signed)
5 refills

## 2013-10-02 NOTE — Telephone Encounter (Signed)
Last seen 5/15 

## 2014-02-24 ENCOUNTER — Other Ambulatory Visit: Payer: Self-pay | Admitting: Nurse Practitioner

## 2014-02-24 NOTE — Telephone Encounter (Signed)
Last seen 08/2013

## 2014-04-17 ENCOUNTER — Other Ambulatory Visit: Payer: Self-pay | Admitting: Family Medicine

## 2014-05-30 ENCOUNTER — Other Ambulatory Visit: Payer: Self-pay | Admitting: Family Medicine

## 2014-06-02 NOTE — Telephone Encounter (Signed)
Soothe this patient may have refills on each medicine with 2 additional refills along with the message that she needs office visit before further refills

## 2014-07-14 ENCOUNTER — Other Ambulatory Visit: Payer: Self-pay | Admitting: Family Medicine

## 2014-07-15 NOTE — Telephone Encounter (Signed)
rf this and 1 refill, needs yearly f/u ov if nedxing more rx

## 2014-07-15 NOTE — Telephone Encounter (Signed)
Duplicate

## 2014-07-15 NOTE — Telephone Encounter (Signed)
Last seen 08/26/13.

## 2014-08-04 ENCOUNTER — Telehealth: Payer: Self-pay | Admitting: Nurse Practitioner

## 2014-08-04 NOTE — Telephone Encounter (Signed)
Pt states that at her last visit with Eber Jonesarolyn they agreed to call if she  Felt she needed a change in her clonazePAM (KLONOPIN) 1 MG tablet  Please advise, pt aware Eber JonesCarolyn out till Wed   She wants to keep the Wellbutrin, dc the Klonopin and replace it with generic xanax PRN

## 2014-08-05 ENCOUNTER — Other Ambulatory Visit: Payer: Self-pay | Admitting: Nurse Practitioner

## 2014-08-05 MED ORDER — ALPRAZOLAM 1 MG PO TABS: 1.0000 mg | ORAL_TABLET | Freq: Two times a day (BID) | ORAL | Status: DC | PRN

## 2014-08-05 NOTE — Telephone Encounter (Signed)
Printed; I will sign Wednesday am. Thanks.

## 2014-08-06 NOTE — Telephone Encounter (Signed)
Discussed with pt. [facility].

## 2014-08-06 NOTE — Telephone Encounter (Signed)
[facility]. Where does pt want to send it.

## 2014-09-10 ENCOUNTER — Other Ambulatory Visit: Payer: Self-pay | Admitting: Family Medicine

## 2014-09-19 NOTE — Telephone Encounter (Signed)
It is not recommended to be on these 2 medicines together. Evidence shows increased risk of serotonin syndrome. Recommended for the patient to follow-up office visit for discussion either with myself or with Eber Jones

## 2014-11-28 ENCOUNTER — Other Ambulatory Visit: Payer: Self-pay | Admitting: Family Medicine

## 2014-12-01 NOTE — Telephone Encounter (Signed)
May have this and 2 refills, needs office visit

## 2014-12-12 ENCOUNTER — Telehealth: Payer: Self-pay | Admitting: Family Medicine

## 2014-12-12 DIAGNOSIS — Z13220 Encounter for screening for lipoid disorders: Secondary | ICD-10-CM

## 2014-12-12 DIAGNOSIS — Z79899 Other long term (current) drug therapy: Secondary | ICD-10-CM

## 2014-12-12 NOTE — Telephone Encounter (Signed)
Needs bw orders for upcoming appt, aware of [facility]  Last labs 08/26/13 Lip, Hep, BMP, TSH, CBC, VitD, Estradiol, Progesterone

## 2014-12-16 ENCOUNTER — Other Ambulatory Visit: Payer: Self-pay | Admitting: Nurse Practitioner

## 2014-12-16 NOTE — Telephone Encounter (Signed)
Orders ready. Pt notified.  

## 2014-12-16 NOTE — Telephone Encounter (Signed)
Please order lipid, liver and met 7; other labs were normal last year.

## 2015-01-09 ENCOUNTER — Ambulatory Visit: Payer: Managed Care, Other (non HMO) | Admitting: Nurse Practitioner

## 2015-01-12 ENCOUNTER — Other Ambulatory Visit: Payer: Self-pay | Admitting: Family Medicine

## 2015-01-17 ENCOUNTER — Other Ambulatory Visit: Payer: Self-pay | Admitting: Family Medicine

## 2015-01-18 LAB — BASIC METABOLIC PANEL
BUN/Creatinine Ratio: 13 (ref 9–23)
BUN: 9 mg/dL (ref 6–24)
CHLORIDE: 100 mmol/L (ref 97–108)
CO2: 25 mmol/L (ref 18–29)
CREATININE: 0.71 mg/dL (ref 0.57–1.00)
Calcium: 8.9 mg/dL (ref 8.7–10.2)
GFR calc Af Amer: 114 mL/min/{1.73_m2} (ref >59)
GFR calc non Af Amer: 99 mL/min/{1.73_m2} (ref >59)
GLUCOSE: 80 mg/dL (ref 65–99)
Potassium: 4.4 mmol/L (ref 3.5–5.2)
SODIUM: 140 mmol/L (ref 134–144)

## 2015-01-18 LAB — LIPID PANEL
CHOLESTEROL TOTAL: 185 mg/dL (ref 100–199)
Chol/HDL Ratio: 2.8 ratio units (ref 0.0–4.4)
HDL: 66 mg/dL (ref >39)
LDL CALC: 99 mg/dL (ref 0–99)
TRIGLYCERIDES: 101 mg/dL (ref 0–149)
VLDL Cholesterol Cal: 20 mg/dL (ref 5–40)

## 2015-01-18 LAB — HEPATIC FUNCTION PANEL
ALK PHOS: 51 IU/L (ref 39–117)
ALT: 8 IU/L (ref 0–32)
AST: 13 IU/L (ref 0–40)
Albumin: 4.1 g/dL (ref 3.5–5.5)
BILIRUBIN TOTAL: 0.7 mg/dL (ref 0.0–1.2)
BILIRUBIN, DIRECT: 0.16 mg/dL (ref 0.00–0.40)
Total Protein: 6.7 g/dL (ref 6.0–8.5)

## 2015-01-21 ENCOUNTER — Encounter: Payer: Self-pay | Admitting: Nurse Practitioner

## 2015-01-21 ENCOUNTER — Ambulatory Visit (INDEPENDENT_AMBULATORY_CARE_PROVIDER_SITE_OTHER): Payer: Managed Care, Other (non HMO) | Admitting: Nurse Practitioner

## 2015-01-21 VITALS — BP 108/64 | Ht 67.0 in | Wt 133.0 lb

## 2015-01-21 DIAGNOSIS — D649 Anemia, unspecified: Secondary | ICD-10-CM | POA: Diagnosis not present

## 2015-01-21 DIAGNOSIS — J069 Acute upper respiratory infection, unspecified: Secondary | ICD-10-CM

## 2015-01-21 DIAGNOSIS — L739 Follicular disorder, unspecified: Secondary | ICD-10-CM

## 2015-01-21 DIAGNOSIS — G4700 Insomnia, unspecified: Secondary | ICD-10-CM

## 2015-01-21 DIAGNOSIS — F419 Anxiety disorder, unspecified: Secondary | ICD-10-CM | POA: Diagnosis not present

## 2015-01-21 MED ORDER — FLUCONAZOLE 150 MG PO TABS: ORAL_TABLET | ORAL | Status: DC

## 2015-01-21 MED ORDER — DOXYCYCLINE HYCLATE 100 MG PO TABS: 100.0000 mg | ORAL_TABLET | Freq: Two times a day (BID) | ORAL | Status: DC

## 2015-01-22 ENCOUNTER — Encounter: Payer: Self-pay | Admitting: Nurse Practitioner

## 2015-01-22 LAB — SPECIMEN STATUS REPORT

## 2015-01-22 NOTE — Progress Notes (Signed)
Subjective:  Presents for routine follow-up on anxiety and insomnia. Doing well on bupropion. Still has occasional anxiety symptoms during the day. Has stopped her Ambien. Now takes 1/2-1 Xanax at bedtime which helps her sleep. Cannot take this during the day due to drowsiness. Has a prescription at the pharmacy to pick up today. Also complaints of a cold for the past several days. Does feel slightly better. Low-grade fever. Slight cough. Sore throat. No ear pain. Headache mainly occipital area. No nausea vomiting. Also has a rash along the border of the scalp particularly in the occipital area, minimally pruritic. Minimally tender. No changes in shampoo or hygiene products. Has a few spots on the chest area and face. Had a hysterectomy a few months ago. Was diagnosed with iron deficiency anemia, request repeat blood work to see if this has resolved.  Objective:   BP 108/64 mmHg  Ht 5\' 7"  (1.702 m)  Wt 133 lb (60.328 kg)  BMI 20.83 kg/m2 NAD. Alert, oriented. Calm affect. Thoughts logical coherent and relevant. TMs mildly retracted, no erythema. Pharynx nonerythematous with cloudy PND noted. Neck supple with mild soft anterior adenopathy. Lungs clear. Heart regular rate rhythm. A few scattered tiny minimally erythematous pustules noted particularly along the border of the hair in the occipital area and behind the ears. Several larger lesions are noted on the face with a few tiny pustules on the chest area. Occurs on both sides of the body. Nontender to palpation.  Assessment:  Problem List Items Addressed This Visit      Other   Anxiety - Primary   Insomnia    Other Visit Diagnoses    Folliculitis        Acute upper respiratory infection        Relevant Medications    fluconazole (DIFLUCAN) 150 MG tablet    Anemia, unspecified anemia type        Relevant Orders    CBC with Differential/Platelet    Ferritin    Iron Binding Cap (TIBC)      Plan: Meds ordered this encounter  Medications   . doxycycline (VIBRA-TABS) 100 MG tablet    Sig: Take 1 tablet (100 mg total) by mouth 2 (two) times daily.    Dispense:  20 tablet    Refill:  0    Order Specific Question:  Supervising Provider    Answer:  Merlyn AlbertLUKING, WILLIAM S [[id number]]  . fluconazole (DIFLUCAN) 150 MG tablet    Sig: Take one tablet po 3 days apart    Dispense:  2 tablet    Refill:  0   Doxycycline as directed for rash. Call back if worsens or persists. Most likely viral illness resolving. Lab work pending. Discussed importance of stress reduction. In a few weeks patient to call back if anxiety during the day is still an issue, will consider low-dose Klonopin for occasional use during the day with Xanax at nighttime for sleep. Hold on flu vaccine today due to illness. Return in about 6 months (around 07/22/2015) for recheck.

## 2015-01-23 LAB — FERRITIN: FERRITIN: 67 ng/mL (ref 15–150)

## 2015-01-23 LAB — SPECIMEN STATUS REPORT

## 2015-01-23 LAB — IRON AND TIBC
IRON SATURATION: 52 % (ref 15–55)
Iron: 152 ug/dL (ref 27–159)
TIBC: 294 ug/dL (ref 250–450)
UIBC: 142 ug/dL (ref 131–425)

## 2015-02-04 ENCOUNTER — Telehealth: Payer: Self-pay | Admitting: Family Medicine

## 2015-02-04 NOTE — Telephone Encounter (Signed)
Pt would like to have Klonopin added for daytime use  [facility]  States she discussed this with Eber Jonesarolyn at last OV Please advise

## 2015-02-05 ENCOUNTER — Other Ambulatory Visit: Payer: Self-pay | Admitting: Nurse Practitioner

## 2015-02-05 MED ORDER — CLONAZEPAM 0.5 MG PO TABS: ORAL_TABLET | ORAL | Status: DC

## 2015-03-18 ENCOUNTER — Other Ambulatory Visit: Payer: Self-pay | Admitting: Nurse Practitioner

## 2015-03-18 ENCOUNTER — Telehealth: Payer: Self-pay | Admitting: Family Medicine

## 2015-03-18 MED ORDER — DOXYCYCLINE HYCLATE 100 MG PO TABS: 100.0000 mg | ORAL_TABLET | Freq: Two times a day (BID) | ORAL | Status: DC

## 2015-03-18 NOTE — Telephone Encounter (Signed)
Just to clarify: Diflucan is not for acne. Does she want Doxycyline for acne?

## 2015-03-18 NOTE — Telephone Encounter (Signed)
(  Message for Rebekah JonesCarolyn) patient states has real bad acne breakout in her face and the diflucan 150 mg not helping made it worst.Needing [facility] [place]

## 2015-03-18 NOTE — Telephone Encounter (Signed)
Medication was Doxycycline.

## 2015-03-18 NOTE — Telephone Encounter (Signed)
Tried to call patient to verify but patient was already prescribed Doxycycline on the same date as diflucan so I believe that patient meant Doxycycline. Awaiting patient to call back to verify but both medications were prescribed in October.

## 2015-03-31 ENCOUNTER — Other Ambulatory Visit: Payer: Self-pay | Admitting: Family Medicine

## 2015-03-31 NOTE — Telephone Encounter (Signed)
Ok plus one ref 

## 2015-04-14 ENCOUNTER — Other Ambulatory Visit: Payer: Self-pay | Admitting: Nurse Practitioner

## 2015-06-28 ENCOUNTER — Other Ambulatory Visit: Payer: Self-pay | Admitting: Family Medicine

## 2015-06-29 NOTE — Telephone Encounter (Signed)
Ok this and 4 refills 

## 2015-07-07 ENCOUNTER — Other Ambulatory Visit: Payer: Self-pay | Admitting: Nurse Practitioner

## 2015-07-08 NOTE — Telephone Encounter (Signed)
May have this and one refill 

## 2015-07-11 ENCOUNTER — Other Ambulatory Visit: Payer: Self-pay | Admitting: Family Medicine

## 2015-08-09 ENCOUNTER — Other Ambulatory Visit: Payer: Self-pay | Admitting: Family Medicine

## 2015-09-16 ENCOUNTER — Other Ambulatory Visit: Payer: Self-pay | Admitting: Family Medicine

## 2015-10-18 ENCOUNTER — Other Ambulatory Visit: Payer: Self-pay | Admitting: Family Medicine

## 2015-11-15 ENCOUNTER — Other Ambulatory Visit: Payer: Self-pay | Admitting: Family Medicine

## 2015-11-16 NOTE — Telephone Encounter (Signed)
This +1 refill needs office visit 

## 2015-11-27 ENCOUNTER — Other Ambulatory Visit: Payer: Self-pay | Admitting: Family Medicine

## 2015-11-27 NOTE — Telephone Encounter (Signed)
1 refill, needs office visit 

## 2015-11-30 ENCOUNTER — Telehealth: Payer: Self-pay | Admitting: Family Medicine

## 2015-11-30 MED ORDER — VALACYCLOVIR HCL 1 G PO TABS: ORAL_TABLET | ORAL | 6 refills | Status: DC

## 2015-11-30 NOTE — Telephone Encounter (Signed)
The best treatment for this is Valtrex 1 g, take 2 pills. Repeat taking 2 pills 12 hours later for a total of 4 pills. May have 6 refills on this. Study showed that this works very well

## 2015-11-30 NOTE — Telephone Encounter (Signed)
Spoke with patient and informed her per Dr.Scott Luking- The best treatment for this valtrex 1 g, take 1 pills. Repeat taking 2 pills 12 hours later for a total of 4 pills. May have 6 refills on this. Study showed that this works well. Patient verbalized understanding. Medication sent into pharmacy.

## 2015-11-30 NOTE — Telephone Encounter (Signed)
Patient is requesting a prescription for fever blisters [facility]

## 2015-11-30 NOTE — Telephone Encounter (Signed)
Left message to return call 

## 2015-12-18 ENCOUNTER — Telehealth: Payer: Self-pay | Admitting: Family Medicine

## 2015-12-18 NOTE — Telephone Encounter (Signed)
Please review pap results from [facility].

## 2015-12-20 ENCOUNTER — Other Ambulatory Visit: Payer: Self-pay | Admitting: Family Medicine

## 2015-12-20 NOTE — Telephone Encounter (Signed)
Patient has office visit on the 18th. We will review this at that time

## 2015-12-21 ENCOUNTER — Telehealth: Payer: Self-pay | Admitting: Family Medicine

## 2015-12-21 DIAGNOSIS — R5383 Other fatigue: Secondary | ICD-10-CM

## 2015-12-21 DIAGNOSIS — Z13220 Encounter for screening for lipoid disorders: Secondary | ICD-10-CM

## 2015-12-21 DIAGNOSIS — Z79899 Other long term (current) drug therapy: Secondary | ICD-10-CM

## 2015-12-21 NOTE — Telephone Encounter (Signed)
CBC, metabolic 7, lipid liver

## 2015-12-21 NOTE — Telephone Encounter (Signed)
1 refill 

## 2015-12-21 NOTE — Telephone Encounter (Signed)
Left message on voicemail notifying patient bloodwork was ordered.  

## 2015-12-21 NOTE — Telephone Encounter (Signed)
Calling to request order for blood wok for physical 12/28/15.

## 2015-12-21 NOTE — Telephone Encounter (Signed)
1 refill, patient has office visit later this month.

## 2015-12-24 LAB — CBC WITH DIFFERENTIAL/PLATELET
BASOS: 0 %
Basophils Absolute: 0 10*3/uL (ref 0.0–0.2)
EOS (ABSOLUTE): 0.1 10*3/uL (ref 0.0–0.4)
EOS: 2 %
HEMOGLOBIN: 12 g/dL (ref 11.1–15.9)
Hematocrit: 35.5 % (ref 34.0–46.6)
IMMATURE GRANS (ABS): 0 10*3/uL (ref 0.0–0.1)
IMMATURE GRANULOCYTES: 0 %
LYMPHS: 21 %
Lymphocytes Absolute: 1 10*3/uL (ref 0.7–3.1)
MCH: 31.7 pg (ref 26.6–33.0)
MCHC: 33.8 g/dL (ref 31.5–35.7)
MCV: 94 fL (ref 79–97)
MONOCYTES: 8 %
Monocytes Absolute: 0.4 10*3/uL (ref 0.1–0.9)
NEUTROS ABS: 3.2 10*3/uL (ref 1.4–7.0)
NEUTROS PCT: 69 %
PLATELETS: 301 10*3/uL (ref 150–379)
RBC: 3.78 x10E6/uL (ref 3.77–5.28)
RDW: 12.5 % (ref 12.3–15.4)
WBC: 4.7 10*3/uL (ref 3.4–10.8)

## 2015-12-24 LAB — LIPID PANEL
CHOLESTEROL TOTAL: 181 mg/dL (ref 100–199)
Chol/HDL Ratio: 2.7 ratio units (ref 0.0–4.4)
HDL: 67 mg/dL (ref >39)
LDL CALC: 97 mg/dL (ref 0–99)
Triglycerides: 84 mg/dL (ref 0–149)
VLDL CHOLESTEROL CAL: 17 mg/dL (ref 5–40)

## 2015-12-24 LAB — HEPATIC FUNCTION PANEL
ALK PHOS: 44 IU/L (ref 39–117)
ALT: 7 IU/L (ref 0–32)
AST: 14 IU/L (ref 0–40)
Albumin: 4.1 g/dL (ref 3.5–5.5)
Bilirubin Total: 0.6 mg/dL (ref 0.0–1.2)
Bilirubin, Direct: 0.17 mg/dL (ref 0.00–0.40)
TOTAL PROTEIN: 6.9 g/dL (ref 6.0–8.5)

## 2015-12-24 LAB — BASIC METABOLIC PANEL
BUN/Creatinine Ratio: 13 (ref 9–23)
BUN: 11 mg/dL (ref 6–24)
CALCIUM: 9 mg/dL (ref 8.7–10.2)
CHLORIDE: 101 mmol/L (ref 96–106)
CO2: 22 mmol/L (ref 18–29)
CREATININE: 0.86 mg/dL (ref 0.57–1.00)
GFR calc non Af Amer: 78 mL/min/{1.73_m2} (ref >59)
GFR, EST AFRICAN AMERICAN: 90 mL/min/{1.73_m2} (ref >59)
Glucose: 85 mg/dL (ref 65–99)
Potassium: 5 mmol/L (ref 3.5–5.2)
Sodium: 140 mmol/L (ref 134–144)

## 2015-12-28 ENCOUNTER — Encounter: Payer: 59 | Admitting: Family Medicine

## 2016-01-10 ENCOUNTER — Other Ambulatory Visit: Payer: Self-pay | Admitting: Family Medicine

## 2016-01-11 ENCOUNTER — Other Ambulatory Visit: Payer: Self-pay | Admitting: Family Medicine

## 2016-01-11 NOTE — Telephone Encounter (Signed)
May have one refill 

## 2016-01-21 ENCOUNTER — Encounter: Payer: Self-pay | Admitting: Family Medicine

## 2016-01-21 ENCOUNTER — Telehealth: Payer: Self-pay | Admitting: Family Medicine

## 2016-01-21 ENCOUNTER — Ambulatory Visit (INDEPENDENT_AMBULATORY_CARE_PROVIDER_SITE_OTHER): Payer: 59 | Admitting: Family Medicine

## 2016-01-21 ENCOUNTER — Other Ambulatory Visit: Payer: Self-pay | Admitting: Family Medicine

## 2016-01-21 VITALS — BP 122/80 | Ht 67.0 in | Wt 129.1 lb

## 2016-01-21 DIAGNOSIS — Z1211 Encounter for screening for malignant neoplasm of colon: Secondary | ICD-10-CM | POA: Diagnosis not present

## 2016-01-21 DIAGNOSIS — N870 Mild cervical dysplasia: Secondary | ICD-10-CM | POA: Diagnosis not present

## 2016-01-21 DIAGNOSIS — Z780 Asymptomatic menopausal state: Secondary | ICD-10-CM | POA: Diagnosis not present

## 2016-01-21 DIAGNOSIS — Z90710 Acquired absence of both cervix and uterus: Secondary | ICD-10-CM | POA: Insufficient documentation

## 2016-01-21 DIAGNOSIS — E784 Other hyperlipidemia: Secondary | ICD-10-CM | POA: Diagnosis not present

## 2016-01-21 DIAGNOSIS — R87629 Unspecified abnormal cytological findings in specimens from vagina: Secondary | ICD-10-CM | POA: Diagnosis not present

## 2016-01-21 DIAGNOSIS — Z1231 Encounter for screening mammogram for malignant neoplasm of breast: Secondary | ICD-10-CM

## 2016-01-21 DIAGNOSIS — Z23 Encounter for immunization: Secondary | ICD-10-CM | POA: Diagnosis not present

## 2016-01-21 DIAGNOSIS — E7849 Other hyperlipidemia: Secondary | ICD-10-CM

## 2016-01-21 MED ORDER — ESTRADIOL 0.5 MG PO TABS: 0.5000 mg | ORAL_TABLET | Freq: Every day | ORAL | 5 refills | Status: DC

## 2016-01-21 MED ORDER — ZOLPIDEM TARTRATE ER 12.5 MG PO TBCR: 12.5000 mg | EXTENDED_RELEASE_TABLET | Freq: Every evening | ORAL | 4 refills | Status: DC | PRN

## 2016-01-21 MED ORDER — SERTRALINE HCL 50 MG PO TABS: 50.0000 mg | ORAL_TABLET | Freq: Every day | ORAL | 3 refills | Status: DC

## 2016-01-21 MED ORDER — ALPRAZOLAM 1 MG PO TABS: 1.0000 mg | ORAL_TABLET | Freq: Every evening | ORAL | 5 refills | Status: DC | PRN

## 2016-01-21 MED ORDER — CLONAZEPAM 0.5 MG PO TABS: ORAL_TABLET | ORAL | 4 refills | Status: DC

## 2016-01-21 NOTE — Patient Instructions (Signed)
Dear Patient,  It has been recommended to you that you have a colonoscopy. It is your responsibility to carry through with this recommendation.   Did you realize that colon cancer is the second leading cancer killer in the [place]. One in every 20 adults will get colon cancer. If all adults would go through the recommended screening for colon cancer (getting a colonoscopy), then there would be a 60% reduction in the number of people dying from colon cancer.  Colon cancer just doesn't come out of the blue. It starts off as a small polyp which over time grows into a cancer. A colonoscopy can prevent cancer and in many cases detected when it is at a very treatable phase. Small colon cancers can have cure rates of 95%. Advanced colon cancer, which often occurs in people who do not do their screenings, have cure rates less than 20%. The risk of colon cancer advances with age. Most adults should have regular colonoscopies every 10 years starting at age 50. This recommendation can vary depending on a person's medical history.  Health-care laws now allow for you to call the gastroenterologist office directly in order to set yourself up for this very important tests. Today we have recommended to you that you do this test. This test may save your life. Failure to do this test puts you at risk for premature death from colon cancer. Do the right thing and schedule this test now.  Here as a list of specialists we recommend in the surrounding area. When you call their office let them know that you are a patient of our practice in your interested in doing a screening colonoscopy. They should assist you without problems. You will need the following information when you called them: 1-name of which Dr. you see, 2-your insurance information, 3-a list of medications that you currently take, 4-any allergies you have to medications.  [place] [facility]ologist Dr. Mike Rourk, Dr Sandi Fields   [place]  gastroenterologist   [phone]  Dr.Najeeb Rehman [facility]   [phone]  [facility] (Dr. Perry, N, Stark, Brodie, Gesner, Jacobs and Pyrtle) [phone]  [facility] (Dr. Buscemi, Edwards, Hayes, [place],Outlaw,Schooler) [phone]  Each group of specialists has assured us that when you called them they will help you get your colonoscopy set up. Should you have problems or if the GI practice insist a referral be done please let us know. Be sure to call soon. Sincerely, [name] Hoskins, Dr Steve Areesha [place], Dr.Otho Michalik    

## 2016-01-21 NOTE — Progress Notes (Signed)
   Subjective:    Patient ID: Rebekah Andrews, female    DOB: 12-25-1965, 50 y.o.   MRN: [id number]  HPI The patient comes in today for a wellness visit.But upon discussion the patient gets her wellness through her gynecologist and she is here more to discuss abnormal Pap smears. She is had multiple abnormal Pap smears with CIN-1 she had a hysterectomy and afterwards her Pap smear of the vagina also showed CIN-1. The patient has seen a oncologist with [facility] but she is uncomfortable with what is being proposed  In addition to this her gynecologist put her on estradiol tablets because of hormonal fluxes she is uncertain if she needs to keep continuing this or change to something different    A review of their health history was completed.  A review of medications was also completed.  Any needed refills; None   Eating habits: Patient states eating habits are ok. Does not eat 3 meals daily.   Falls/  MVA accidents in past few months: None   Regular exercise: Patient states walks 30 minutes a day.  Specialist pt sees on regular basis: Oncologist  Preventative health issues were discussed.   Additional concerns: Patient would like to discuss Estrace and Estradiol.   Review of Systems  Constitutional: Positive for fatigue. Negative for fever and unexpected weight change.  HENT: Negative for congestion.   Respiratory: Negative for cough and shortness of breath.   Cardiovascular: Negative for chest pain.  Gastrointestinal: Negative for abdominal pain.  Genitourinary: Negative for vaginal pain.  Musculoskeletal: Negative for arthralgias and back pain.       Objective:   Physical Exam  Constitutional: She appears well-nourished. No distress.  Cardiovascular: Normal rate, regular rhythm and normal heart sounds.   No murmur heard. Pulmonary/Chest: Effort normal and breath sounds normal. No respiratory distress.  Abdominal: Soft. There is no tenderness. There is no rebound.    Musculoskeletal: She exhibits no edema.  Lymphadenopathy:    She has no cervical adenopathy.  Neurological: She is alert. She exhibits normal muscle tone.  Skin: Skin is warm and dry. No rash noted.  Psychiatric: Her behavior is normal.  Vitals reviewed.   Patient signing release for records from oncologist as well as the hospital where she had hysterectomy to surgical pathology      Assessment & Plan:  Menopausal symptoms-is on Estrace-I would recommend a lower dose. We will go to 0.5 mg. This would be safer for her with less risk of heart disease stroke. In the future she is doing well potentially this could be stopped. She is in the process of getting a new gynecologist.  CIN-currently going to oncologist in [place]. She finds himself feeling frustrated and confused we will get the records from oncology review these. We will be assisting the patient was setting her up [facility]  Hyperlipidemia-does not need to be on medications currently watch diet recheck it again in 6 months  Vaginal atrophy issues use Estrace cream intravaginally once per week  Mammogram recommended colonoscopy recommended  Follow-up based on records -we are trying to obtain those records. This will help guide us on further decisions.

## 2016-01-21 NOTE — Telephone Encounter (Signed)
I will discuss case with Dr Galen ManilaPenland next week regarding who she might recommend regarding this patient

## 2016-01-25 ENCOUNTER — Ambulatory Visit (HOSPITAL_COMMUNITY)
Admission: RE | Admit: 2016-01-25 | Discharge: 2016-01-25 | Disposition: A | Discharge status: Home / Self Care | Payer: 59 | Source: Ambulatory Visit | Attending: Family Medicine | Admitting: Family Medicine

## 2016-01-25 ENCOUNTER — Encounter (HOSPITAL_COMMUNITY): Payer: Self-pay | Admitting: Radiology

## 2016-01-25 DIAGNOSIS — Z1231 Encounter for screening mammogram for malignant neoplasm of breast: Secondary | ICD-10-CM | POA: Insufficient documentation

## 2016-02-01 NOTE — Telephone Encounter (Signed)
Please let the patient know that I did speak with the oncologist at the hospital. It is felt that [facility] would be the best option. They have a multidisciplinary clinic. I would like for the patient to be referred to them. The medical records from her gynecologist and oncologist are being scanned into the system

## 2016-02-02 NOTE — Telephone Encounter (Signed)
Referral ordered in EPIC. 

## 2016-02-02 NOTE — Telephone Encounter (Signed)
Left message to return call 

## 2016-02-02 NOTE — Addendum Note (Signed)
Addended by: Margaretha SheffieldBROWN, AUTUMN S on: 02/02/2016 09:34 AM   Modules accepted: Orders

## 2016-02-03 NOTE — Telephone Encounter (Signed)
Patient advised that Dr Lorin PicketScott did speak with the oncologist at the hospital. It is felt that [facility] gynecology oncologist would be the best option. They have a multidisciplinary clinic. Dr Lorin PicketScott would like for the patient to be referred to them. The medical records from her gynecologist and oncologist are being scanned into the system. Referral ordered in EPIC. Patient verbalized understanding.

## 2016-02-17 ENCOUNTER — Other Ambulatory Visit: Payer: Self-pay | Admitting: Family Medicine

## 2016-02-17 ENCOUNTER — Telehealth: Payer: Self-pay | Admitting: Family Medicine

## 2016-02-17 NOTE — Telephone Encounter (Signed)
[facility] is okay to go ahead and cancel this appointment and to please be sure that the specialists send us a copy of what they do so we are kept aware of what is going on.

## 2016-02-17 NOTE — Telephone Encounter (Signed)
Patient wanted to know if she should keep her appointment on 11/16 she states doing well on new medication and has appointment with specialist and doesn't think she needs to come in.So should it be cancelled.

## 2016-02-17 NOTE — Telephone Encounter (Signed)
I think it would be fine for her to cancel the appointment. Please remind the patient to make sure the specialists send us a copy of what they do so we are kept aware of what is going on thank you if we can be of any other help she needs to connect with us.

## 2016-02-18 NOTE — Telephone Encounter (Signed)
May have this +5 additional refills 

## 2016-02-25 ENCOUNTER — Ambulatory Visit: Payer: 59 | Admitting: Family Medicine

## 2016-02-25 ENCOUNTER — Telehealth: Payer: Self-pay | Admitting: Family Medicine

## 2016-02-25 DIAGNOSIS — R252 Cramp and spasm: Secondary | ICD-10-CM

## 2016-02-25 NOTE — Telephone Encounter (Signed)
Patient was changed from Wellbutrin to Zoloft recently.  For the past week and a half she is getting cramps in her feet and hands especially at night.  She wants to know if this is normal with this medication?  Also, she wants to know if her magnesium and potassium were checked when she had her lab work done?

## 2016-02-25 NOTE — Telephone Encounter (Signed)
Spoke with patient and informed her per Dr.Scott Luking- Potassium was checked. Typically magnesium is not checked unless you are having frequent cramping or has severely low potassium. It is not likely that the medication is a cause of the cramping. If the cramping persists Dr.Scott recommends to recheck the potassium, also check magnesium and TSH, and free T4. Patient verbalized understanding and stated that she would like to have these labs drawn.

## 2016-02-25 NOTE — Telephone Encounter (Signed)
Potassium was checked. Typically magnesium is not checked unless patient having frequent cramping or has severely low potassium. It is not likely that the medication is a cause of the cramping. If the cramping persists I recommend recheck potassium, also check magnesium and TSH, free T4-if the patient would like to go ahead and check those that can be done

## 2016-02-28 LAB — BASIC METABOLIC PANEL
BUN / CREAT RATIO: 14 (ref 9–23)
BUN: 10 mg/dL (ref 6–24)
CHLORIDE: 99 mmol/L (ref 96–106)
CO2: 26 mmol/L (ref 18–29)
Calcium: 9.3 mg/dL (ref 8.7–10.2)
Creatinine, Ser: 0.74 mg/dL (ref 0.57–1.00)
GFR, EST AFRICAN AMERICAN: 108 mL/min/{1.73_m2} (ref >59)
GFR, EST NON AFRICAN AMERICAN: 93 mL/min/{1.73_m2} (ref >59)
Glucose: 76 mg/dL (ref 65–99)
Potassium: 4.3 mmol/L (ref 3.5–5.2)
Sodium: 140 mmol/L (ref 134–144)

## 2016-02-28 LAB — MAGNESIUM: Magnesium: 2.1 mg/dL (ref 1.6–2.3)

## 2016-02-28 LAB — T4, FREE: Free T4: 1.23 ng/dL (ref 0.82–1.77)

## 2016-02-28 LAB — TSH: TSH: 0.97 u[IU]/mL (ref 0.450–4.500)

## 2016-03-21 ENCOUNTER — Other Ambulatory Visit: Payer: Self-pay | Admitting: Family Medicine

## 2016-03-22 NOTE — Telephone Encounter (Signed)
This is the frustrating part of Epic-for the past several years she is been on this medicine then suddenly the medicine is disappeared off the list I'm not certain why please talk with the patient find out is she or if she not taking this medicine currently?

## 2016-03-23 ENCOUNTER — Telehealth: Payer: Self-pay | Admitting: Family Medicine

## 2016-03-23 MED ORDER — BUPROPION HCL ER (SR) 150 MG PO TB12: ORAL_TABLET | ORAL | 3 refills | Status: DC

## 2016-03-23 MED ORDER — FLUCONAZOLE 150 MG PO TABS: ORAL_TABLET | ORAL | 0 refills | Status: DC

## 2016-03-23 NOTE — Telephone Encounter (Signed)
Diflucan 150 two, one po three d apart  wellbutirn sr 150 one qam for three d then one bid, three mo worth

## 2016-03-23 NOTE — Telephone Encounter (Signed)
Meds sent to pharmacy. Left message notifying patient on voicemail.

## 2016-03-23 NOTE — Telephone Encounter (Signed)
Pt called stating that she quit taking the Zoloft and the leg cramps went away. Pt is wanting to go back on the Wellbutrin. Pt is needing refills on the Wellbutrin sent in.    Pt is also needing something called in for a yeast inf. Today if possible.   [facility]

## 2016-04-25 ENCOUNTER — Telehealth: Payer: Self-pay | Admitting: Family Medicine

## 2016-04-25 NOTE — Telephone Encounter (Signed)
Patient was sent to [facility] about 3 weeks ago by Dr. Lorin PicketScott due to having an abnormal pap and they did additional testing on her.  She wants to know if we have received these results yet?

## 2016-04-26 NOTE — Telephone Encounter (Signed)
Left message to return call 

## 2016-04-26 NOTE — Telephone Encounter (Signed)
Patient advised #1 Dr Lorin PicketScott does not have access to those records-Dr Lorin PicketScott checked care everywhere in it is not posted on there. #2 Dr Lorin PicketScott would recommend the patient call the specialists and requests the results of the tests to be told to her #3 also had the patient requests the specialists fax or send us a copy so we will have one for our records as well. Patient verbalized understanding and stated she will have records sent to Dr Lorin PicketScott.

## 2016-04-26 NOTE — Telephone Encounter (Signed)
#  1 I do not have access to those records-I checked care everywhere in it is not posted on there. #2 I would recommend the patient call the specialists and requests the results of the tests to be told to her #3 also had the patient requests the specialists fax or send us a copy so we will have one for our records as well

## 2016-04-30 ENCOUNTER — Other Ambulatory Visit: Payer: Self-pay | Admitting: Family Medicine

## 2016-05-02 NOTE — Telephone Encounter (Signed)
May have this +4 refills 

## 2016-05-18 ENCOUNTER — Ambulatory Visit (INDEPENDENT_AMBULATORY_CARE_PROVIDER_SITE_OTHER): Payer: 59 | Admitting: Family Medicine

## 2016-05-18 ENCOUNTER — Encounter: Payer: Self-pay | Admitting: Family Medicine

## 2016-05-18 VITALS — Temp 98.9°F | Ht 67.0 in | Wt 127.0 lb

## 2016-05-18 DIAGNOSIS — B349 Viral infection, unspecified: Secondary | ICD-10-CM | POA: Diagnosis not present

## 2016-05-18 DIAGNOSIS — B9689 Other specified bacterial agents as the cause of diseases classified elsewhere: Secondary | ICD-10-CM

## 2016-05-18 DIAGNOSIS — J019 Acute sinusitis, unspecified: Secondary | ICD-10-CM | POA: Diagnosis not present

## 2016-05-18 MED ORDER — CEFPROZIL 500 MG PO TABS: 500.0000 mg | ORAL_TABLET | Freq: Two times a day (BID) | ORAL | 0 refills | Status: DC

## 2016-05-18 NOTE — Progress Notes (Signed)
Patient began this past 2 weeks with a low bit of sinus congestion postnasal drainage then after that started experiencing more of sinus pressure in over the past weekend started having some moderate headache all over but worse behind the eyes and also feeling facial pressure when bending over. No wheezing or difficulty breathing no vomiting or diarrhea no rash or neck stiffness. PMH benign typically does not get ill eardrums are normal throat is normal mild sinus tenderness lungs are clear no crackles heart is regular Viral syndrome/secondary rhinosinusitis-Cefzil 500 mg twice a day 10 days as directed warning signs discussed follow-up if progressive troubles patient was warned regarding pneumonia. I do not feel she has the flu. We discussed typical symptoms of the flu.

## 2016-05-24 ENCOUNTER — Telehealth: Payer: Self-pay | Admitting: Family Medicine

## 2016-05-24 NOTE — Telephone Encounter (Signed)
Patient would like a medication called in for fever blisters. Patient was wondering if getting a chicken pox shot or shingles shot will help prevent fever blisters. Patient has had chicken pox and shingles before. A friend of hers got a chicken pox shot and it stopped her fever blisters ?

## 2016-05-24 NOTE — Telephone Encounter (Signed)
Although they are somewhat related the virus that causes a cold sores not the same as a chickenpox/shingles illness. It is possible the vaccine could reduce the frequency of fever blisters. In regards to fever blisters there are 2 options if she is getting frequent flareups then Valtrex taken daily throughout the year is the best approach if it's just more that she would like to have something for periodic use when it does occur then Valtrex 1 g tablet 2 pills in the morning 2 pills in the evening for 1 day, #4, 4 refills. Obviously if she would prefer the daily Valtrex let me know in which I will dictate how to do that

## 2016-05-24 NOTE — Telephone Encounter (Signed)
Pt is wanting something called in for a fever blister. Pt is also wanting a nurse to call her back.     [facility]

## 2016-05-24 NOTE — Telephone Encounter (Signed)
Patient says that stress brings out her fever blisters and she usually keeps them for several weeks so she would like to try daily therapy for a while to see if that helps  She would like them sent to [facility]

## 2016-05-25 MED ORDER — VALACYCLOVIR HCL 500 MG PO TABS: 500.0000 mg | ORAL_TABLET | Freq: Every day | ORAL | 5 refills | Status: DC

## 2016-05-25 NOTE — Telephone Encounter (Signed)
Prescription sent electronically to pharmacy. Patient notified. 

## 2016-05-25 NOTE — Telephone Encounter (Signed)
When I would recommend at this point is using Valtrex 500 mg 1 daily, #30, 5 refills-most people will use this once daily over course of 3-6 months. By taking this once daily the medication acts as a suppressing dose to try to prevent flareups of fever blisters. If they are having very good success some patients will try a drug holiday where they stop the medicine to see if the problem will stay away-obviously if the problem comes back it is best to just continue the medication daily as a suppressive dose

## 2016-07-11 ENCOUNTER — Other Ambulatory Visit: Payer: Self-pay | Admitting: Family Medicine

## 2016-07-27 DIAGNOSIS — H16042 Marginal corneal ulcer, left eye: Secondary | ICD-10-CM | POA: Diagnosis not present

## 2016-08-26 ENCOUNTER — Other Ambulatory Visit: Payer: Self-pay | Admitting: Family Medicine

## 2016-08-30 ENCOUNTER — Other Ambulatory Visit: Payer: Self-pay | Admitting: Family Medicine

## 2016-08-31 NOTE — Telephone Encounter (Signed)
May have this and 2 refills needs office visit this summer

## 2016-09-01 ENCOUNTER — Other Ambulatory Visit: Payer: Self-pay | Admitting: Family Medicine

## 2016-09-02 NOTE — Telephone Encounter (Signed)
Last seen 01/21/2016

## 2016-09-02 NOTE — Telephone Encounter (Signed)
May have this +3 refills 

## 2016-09-20 ENCOUNTER — Ambulatory Visit (INDEPENDENT_AMBULATORY_CARE_PROVIDER_SITE_OTHER): Payer: 59 | Admitting: Family Medicine

## 2016-09-20 ENCOUNTER — Encounter: Payer: Self-pay | Admitting: Family Medicine

## 2016-09-20 VITALS — BP 122/84 | Temp 98.4°F | Ht 67.0 in | Wt 127.0 lb

## 2016-09-20 DIAGNOSIS — B9689 Other specified bacterial agents as the cause of diseases classified elsewhere: Secondary | ICD-10-CM | POA: Diagnosis not present

## 2016-09-20 DIAGNOSIS — J019 Acute sinusitis, unspecified: Secondary | ICD-10-CM | POA: Diagnosis not present

## 2016-09-20 MED ORDER — FLUCONAZOLE 150 MG PO TABS: ORAL_TABLET | ORAL | 0 refills | Status: DC

## 2016-09-20 MED ORDER — CEFPROZIL 500 MG PO TABS: 500.0000 mg | ORAL_TABLET | Freq: Two times a day (BID) | ORAL | 0 refills | Status: DC

## 2016-09-20 NOTE — Progress Notes (Signed)
   Subjective:    Patient ID: [name], female    DOB: Aug 03, 1965, 51 y.o.   MRN: [id number]  Sinusitis  This is a new problem. Episode onset: one week. Associated symptoms include coughing, ear pain, headaches and a sore throat. (Fever)    One wk ago pressure  And cong  Frontal heazche  No x of smoking  No sickness in th family   Tried advi siny  Left eye was inflammed felt to be due to conunctivitis , used a abx eye drop, called the eye doc and siaid see you in ten days  Hx of eye ulcer behind eye, wore no contact for five d   Review of Systems  HENT: Positive for ear pain and sore throat.   Respiratory: Positive for cough.   Neurological: Positive for headaches.       Objective:   Physical Exam Alert, mild malaise. Hydration good Vitals stable. frontal/ maxillary tenderness evident positive nasal congestion. pharynx normal neck supple  lungs clear/no crackles or wheezes. heart regular in rhythm        Assessment & Plan:  Impression rhinosinusitis likely post viral, discussed with patient. plan antibiotics prescribed. Questions answered. Symptomatic care discussed. warning signs discussed. WSL Also had hx of corneal tiny ulcer left eye, ovrall much better and due to see opthalm next w

## 2016-09-21 ENCOUNTER — Ambulatory Visit: Payer: 59 | Admitting: Nurse Practitioner

## 2016-09-26 ENCOUNTER — Other Ambulatory Visit: Payer: Self-pay | Admitting: Family Medicine

## 2016-09-26 NOTE — Telephone Encounter (Signed)
1 refill with one additional refill needs office visit 

## 2016-10-27 ENCOUNTER — Ambulatory Visit (INDEPENDENT_AMBULATORY_CARE_PROVIDER_SITE_OTHER): Payer: 59 | Admitting: Family Medicine

## 2016-10-27 ENCOUNTER — Encounter: Payer: Self-pay | Admitting: Family Medicine

## 2016-10-27 VITALS — BP 116/78 | Temp 98.7°F | Ht 67.0 in | Wt 126.5 lb

## 2016-10-27 DIAGNOSIS — I878 Other specified disorders of veins: Secondary | ICD-10-CM

## 2016-10-27 DIAGNOSIS — J329 Chronic sinusitis, unspecified: Secondary | ICD-10-CM | POA: Diagnosis not present

## 2016-10-27 DIAGNOSIS — M7989 Other specified soft tissue disorders: Secondary | ICD-10-CM | POA: Diagnosis not present

## 2016-10-27 MED ORDER — AZITHROMYCIN 250 MG PO TABS: ORAL_TABLET | ORAL | 0 refills | Status: DC

## 2016-10-27 MED ORDER — ALBUTEROL SULFATE HFA 108 (90 BASE) MCG/ACT IN AERS: 2.0000 | INHALATION_SPRAY | Freq: Four times a day (QID) | RESPIRATORY_TRACT | 0 refills | Status: DC | PRN

## 2016-10-27 NOTE — Progress Notes (Signed)
   Subjective:    Patient ID: Rebekah Andrews, female    DOB: 10-11-1965, 51 y.o.   MRN: [id number]  Cough  This is a new problem. The current episode started in the past 7 days. The cough is productive of sputum. Associated symptoms include ear pain, headaches, nasal congestion, rhinorrhea, a sore throat and wheezing. Associated symptoms comments: Diarrhea .   Cough and cong and sore throat  Very sore in the chest Sunday  tok mucinex pm  Pos cough and cong and wheezing  Had bronchitis in the past  An has used albuterol   Left calf a little swollen and   Left leg  Left arm numb and tingly   Left leg an ankle   nno leg irritatio  Patient also has concerns of swelling to calf and ankle. , Several weeks duration. Remembers no injury. No history DVT. Patient does not smoke. Review of Systems  HENT: Positive for ear pain, rhinorrhea and sore throat.   Respiratory: Positive for cough and wheezing.   Neurological: Positive for headaches.       Objective:   Physical Exam  Alert and oriented, vitals reviewed and stable, NAD ENT-TM's and ext canals , positive nasal congestion positive frontal tenderness. WNL bilat via otoscopic exam Soft palate, tonsils and post pharynx WNL via oropharyngeal exam Neck-symmetric, no masses; thyroid nonpalpable and nontender Pulmonary-no tachypnea or accessory muscle use; Clear without wheezes via auscultation Card--no abnrml murmurs, rhythm reg and rate WNL Carotid pulses symmetric, without bruits Left leg 1+ edema at most negative Homans sign some Tenderness    Assessment & Plan:  Impression 1 rhinosinusitis 2 left leg nonspecific Symptomatology with mild edema. This could be simply venous stasis starting to declare itself this point. Often can present unilateral discussed. However need be sure no DVT present long discussion held. Initiate d-dimer and ultrasound. Antibiotics prescribed.  Addendum ultrasound and d-dimer fortunately returned  negative likely this represents benign venous stasis. Next  Patient also mentioned half years worth of numbness in left arm. I told her she needed to follow-up with Dr. Lorin PicketScott for this

## 2016-10-28 ENCOUNTER — Ambulatory Visit (HOSPITAL_COMMUNITY)
Admission: RE | Admit: 2016-10-28 | Discharge: 2016-10-28 | Disposition: A | Discharge status: Home / Self Care | Payer: 59 | Source: Ambulatory Visit | Attending: Family Medicine | Admitting: Family Medicine

## 2016-10-28 ENCOUNTER — Other Ambulatory Visit (HOSPITAL_COMMUNITY)
Admission: RE | Admit: 2016-10-28 | Discharge: 2016-10-28 | Disposition: A | Discharge status: Home / Self Care | Payer: 59 | Source: Ambulatory Visit | Attending: Family Medicine | Admitting: Family Medicine

## 2016-10-28 DIAGNOSIS — M7989 Other specified soft tissue disorders: Secondary | ICD-10-CM | POA: Diagnosis not present

## 2016-10-28 LAB — D-DIMER, QUANTITATIVE: D-Dimer, Quant: <0.27 ug/mL-FEU (ref 0.00–0.50)

## 2016-10-29 DIAGNOSIS — I878 Other specified disorders of veins: Secondary | ICD-10-CM | POA: Insufficient documentation

## 2016-10-31 ENCOUNTER — Other Ambulatory Visit: Payer: Self-pay | Admitting: Family Medicine

## 2016-10-31 NOTE — Telephone Encounter (Signed)
Last seen 10/26/16 

## 2016-11-01 ENCOUNTER — Telehealth: Payer: Self-pay | Admitting: *Deleted

## 2016-11-01 NOTE — Telephone Encounter (Signed)
Spoke with patient and stated that she did not need the inhaler she already had one at home so she did not need us to do the prior [place].

## 2016-11-01 NOTE — Telephone Encounter (Signed)
Left message to return call. Insurance is not covering proventil hfa inhaler. This was prescribed on 7/19 when pt came in for a sick visit. Wanted to ask pt if she wanted us to do precert or if she paid cash and already got inhaler or if she still needs since she was seen last week.

## 2016-11-03 ENCOUNTER — Ambulatory Visit: Payer: 59 | Admitting: Family Medicine

## 2016-12-06 ENCOUNTER — Other Ambulatory Visit: Payer: Self-pay | Admitting: Family Medicine

## 2016-12-06 NOTE — Telephone Encounter (Signed)
May have 30 day prescription, needs office visit, please send a card stating no further refills on this medicine until office visit specifically for medication check

## 2016-12-10 ENCOUNTER — Other Ambulatory Visit: Payer: Self-pay | Admitting: Family Medicine

## 2016-12-13 NOTE — Telephone Encounter (Signed)
Last seen for chronic visit October 2017

## 2016-12-13 NOTE — Telephone Encounter (Signed)
May have one refill, send card letting her know no further refills still seen

## 2016-12-23 ENCOUNTER — Other Ambulatory Visit: Payer: Self-pay | Admitting: Family Medicine

## 2017-01-07 ENCOUNTER — Other Ambulatory Visit: Payer: Self-pay | Admitting: Family Medicine

## 2017-01-09 NOTE — Telephone Encounter (Signed)
She may have a 20 day  supply of each, please mail a card to her letting her know we cannot keep prescribing until she comes in to be seen

## 2017-01-10 ENCOUNTER — Other Ambulatory Visit: Payer: Self-pay | Admitting: Family Medicine

## 2017-01-10 ENCOUNTER — Telehealth: Payer: Self-pay | Admitting: Family Medicine

## 2017-01-10 NOTE — Telephone Encounter (Signed)
Last seen 01/21/16 for a wellness

## 2017-01-10 NOTE — Telephone Encounter (Signed)
Requesting Rx for Ambien.  She said pharmacy did not receive what we sent over yesterday.   [facility]

## 2017-01-10 NOTE — Telephone Encounter (Signed)
She may have 30 tablets, send her a card no further medications until follow-up visit

## 2017-01-10 NOTE — Telephone Encounter (Signed)
Med sent to pharm. Pt notified on voicemail.  

## 2017-02-04 ENCOUNTER — Other Ambulatory Visit: Payer: Self-pay | Admitting: Family Medicine

## 2017-02-06 NOTE — Telephone Encounter (Signed)
Refused the refills, patient needs to call to set up a appointment for medication checkup-if she does that then we will allow for enough to cover her until she comes in

## 2017-02-07 ENCOUNTER — Telehealth: Payer: Self-pay | Admitting: Family Medicine

## 2017-02-07 MED ORDER — BUPROPION HCL ER (SMOKING DET) 150 MG PO TB12: ORAL_TABLET | ORAL | 0 refills | Status: DC

## 2017-02-07 MED ORDER — ZOLPIDEM TARTRATE ER 12.5 MG PO TBCR: 12.5000 mg | EXTENDED_RELEASE_TABLET | Freq: Every evening | ORAL | 0 refills | Status: DC | PRN

## 2017-02-07 NOTE — Telephone Encounter (Signed)
She may have a 30-day supply of each

## 2017-02-07 NOTE — Telephone Encounter (Signed)
Pt had zolpidem called in on 01/10/2017 # 30 ,and the bupropion was sent in on 01/09/2017 # 20.

## 2017-02-07 NOTE — Telephone Encounter (Signed)
Prescriptions sent to pharmacy. Patient notified. ?

## 2017-02-07 NOTE — Telephone Encounter (Signed)
I recommend to refuse these patient needs to call to set up medicine check then we can refill them

## 2017-02-07 NOTE — Telephone Encounter (Signed)
Patient has an appointment on 02/13/17 with Dr. Lorin PicketScott.  She is requesting Rx for Bupropion and Zolpidem tartrate.   [facility]

## 2017-02-13 ENCOUNTER — Encounter: Payer: Self-pay | Admitting: Family Medicine

## 2017-02-13 ENCOUNTER — Ambulatory Visit: Payer: 59 | Admitting: Family Medicine

## 2017-02-13 VITALS — BP 122/70 | Temp 98.5°F | Ht 67.0 in | Wt 128.0 lb

## 2017-02-13 DIAGNOSIS — Z23 Encounter for immunization: Secondary | ICD-10-CM

## 2017-02-13 DIAGNOSIS — R5383 Other fatigue: Secondary | ICD-10-CM

## 2017-02-13 DIAGNOSIS — J310 Chronic rhinitis: Secondary | ICD-10-CM | POA: Diagnosis not present

## 2017-02-13 DIAGNOSIS — F320 Major depressive disorder, single episode, mild: Secondary | ICD-10-CM | POA: Diagnosis not present

## 2017-02-13 MED ORDER — ALPRAZOLAM 1 MG PO TABS: ORAL_TABLET | ORAL | 5 refills | Status: DC

## 2017-02-13 MED ORDER — SERTRALINE HCL 50 MG PO TABS: ORAL_TABLET | ORAL | 0 refills | Status: DC

## 2017-02-13 MED ORDER — ZOLPIDEM TARTRATE ER 12.5 MG PO TBCR: 12.5000 mg | EXTENDED_RELEASE_TABLET | Freq: Every evening | ORAL | 5 refills | Status: DC | PRN

## 2017-02-13 NOTE — Progress Notes (Signed)
   Subjective:    Patient ID: Rebekah Andrews, female    DOB: [birth year], 51 y.o.   MRN: [id number]  Anxiety  Presents for follow-up visit.    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  Patient relates a little bit of head congestion drainage cough denies high fever chills wheezing denies sinus pressure pain discomfort  Wants flu vaccine.    Review of Systems Denies headaches wheezing difficulty breathing nausea vomiting diarrhea fever chills sweats    Objective:   Physical Exam Lungs clear heart regular pulse normal neck no masses skin warm dry eardrums throat normal  Patient defers on counseling    Assessment & Plan:  Rhinitis-possibly allergic rhinitis try allergy tablet and allergy nasal spray  Significant component of anxiety and mild depression recommend switching medications stop Wellbutrin.  Recommend Zoloft 50 mg daily patient to give us an update in 2-3 weeks how she is doing she is not suicidal or homicidal we will follow her up in 3-4 months  Insomnia issues uses Ambien CR at nighttime and occasionally a Xanax she is aware that she is not to drive within 8 hours of taking those medicines  We will do lab work to rule out thyroid component to her anxiety

## 2017-03-06 ENCOUNTER — Other Ambulatory Visit: Payer: Self-pay | Admitting: Family Medicine

## 2017-03-06 NOTE — Telephone Encounter (Signed)
Last seen 02/13/17

## 2017-03-06 NOTE — Telephone Encounter (Signed)
May have this +6 refills 

## 2017-03-15 ENCOUNTER — Other Ambulatory Visit: Payer: Self-pay | Admitting: Family Medicine

## 2017-03-15 NOTE — Telephone Encounter (Signed)
Last seen 02/2017 for sinus problems

## 2017-05-01 ENCOUNTER — Ambulatory Visit: Payer: 59 | Admitting: Family Medicine

## 2017-05-01 ENCOUNTER — Encounter: Payer: Self-pay | Admitting: Family Medicine

## 2017-05-01 VITALS — BP 132/70 | Temp 99.4°F | Ht 67.0 in | Wt 133.6 lb

## 2017-05-01 DIAGNOSIS — J329 Chronic sinusitis, unspecified: Secondary | ICD-10-CM

## 2017-05-01 MED ORDER — CEFDINIR 300 MG PO CAPS: 300.0000 mg | ORAL_CAPSULE | Freq: Two times a day (BID) | ORAL | 0 refills | Status: DC

## 2017-05-01 NOTE — Progress Notes (Signed)
   Subjective:    Patient ID: Tennis ShipMelissa G Galano, female    DOB: 1965/04/16, 52 y.o.   MRN: [id number]  Sinusitis  This is a new problem. Episode onset: 4 days. Associated symptoms include congestion, coughing, headaches and a sore throat. (Diarrhea, wheezing) Treatments tried: mucinex.   thur morn started with diarrhea Felt bad later that day, sat got into the chest  Gunky and yellow disch  Throat discomfort and gagging and feeling bad   Dim energy  dyll head ache   Fatigued      Sore ribs pr persitent cough ong       Review of Systems  HENT: Positive for congestion and sore throat.   Respiratory: Positive for cough.   Neurological: Positive for headaches.       Objective:   Physical Exam Alert, mild malaise. Hydration good Vitals stable. frontal/ maxillary tenderness evident positive nasal congestion. pharynx normal neck supple  lungs clear/no crackles or wheezes. heart regular in rhythm        Assessment & Plan:  Impression rhinosinusitis likely post viral, discussed with patient. plan antibiotics prescribed. Questions answered. Symptomatic care discussed. warning signs discussed. WSL

## 2017-05-04 ENCOUNTER — Telehealth: Payer: Self-pay | Admitting: Family Medicine

## 2017-05-04 NOTE — Telephone Encounter (Signed)
There are 2 issues: one is infection and the other is congestion. What is she taking for congestion?

## 2017-05-04 NOTE — Telephone Encounter (Signed)
Patient seen Dr. Brett CanalesSteve on 05/01/17 for rhinosinusitis.  She was prescribed cefdinir but doesn't feel it is strong enough, because she still has a lot of head congestion.  She is requesting something stronger to be called in.  [facility] [place]

## 2017-05-04 NOTE — Telephone Encounter (Signed)
Patient states she is taking the fast acting Mucinex (plain) but she is having thick congestion in her head and cough

## 2017-05-16 ENCOUNTER — Ambulatory Visit: Payer: 59 | Admitting: Family Medicine

## 2017-09-21 DIAGNOSIS — I781 Nevus, non-neoplastic: Secondary | ICD-10-CM | POA: Diagnosis not present

## 2017-09-21 DIAGNOSIS — M2011 Hallux valgus (acquired), right foot: Secondary | ICD-10-CM | POA: Diagnosis not present

## 2017-09-30 ENCOUNTER — Other Ambulatory Visit: Payer: Self-pay | Admitting: Family Medicine

## 2017-10-02 NOTE — Telephone Encounter (Signed)
1 month refills on each, needs office visit because of medications she is on

## 2017-10-02 NOTE — Telephone Encounter (Signed)
-   please see the previous message-this patient needs an office visit specific for sleep and psychiatric meds-this is not typically covered at a sinus infection thank you

## 2017-10-27 ENCOUNTER — Other Ambulatory Visit: Payer: Self-pay | Admitting: Family Medicine

## 2017-10-27 NOTE — Telephone Encounter (Signed)
May have this +3 refills will need office visit this fall

## 2017-12-26 DIAGNOSIS — Z23 Encounter for immunization: Secondary | ICD-10-CM | POA: Diagnosis not present

## 2017-12-28 DIAGNOSIS — R5383 Other fatigue: Secondary | ICD-10-CM | POA: Diagnosis not present

## 2017-12-28 DIAGNOSIS — D649 Anemia, unspecified: Secondary | ICD-10-CM | POA: Diagnosis not present

## 2018-02-09 ENCOUNTER — Other Ambulatory Visit: Payer: Self-pay | Admitting: Family Medicine

## 2018-02-13 NOTE — Telephone Encounter (Signed)
1 refill NEEDS OV

## 2018-02-13 NOTE — Telephone Encounter (Signed)
duplicate

## 2018-02-28 ENCOUNTER — Telehealth: Payer: Self-pay | Admitting: Family Medicine

## 2018-02-28 ENCOUNTER — Other Ambulatory Visit: Payer: Self-pay | Admitting: Family Medicine

## 2018-02-28 DIAGNOSIS — E7849 Other hyperlipidemia: Secondary | ICD-10-CM

## 2018-02-28 DIAGNOSIS — Z1231 Encounter for screening mammogram for malignant neoplasm of breast: Secondary | ICD-10-CM

## 2018-02-28 DIAGNOSIS — R5383 Other fatigue: Secondary | ICD-10-CM

## 2018-02-28 DIAGNOSIS — Z79899 Other long term (current) drug therapy: Secondary | ICD-10-CM

## 2018-02-28 NOTE — Telephone Encounter (Signed)
Pt has CPE scheduled for 12/10. She would like to have her lab work done before appt.

## 2018-02-28 NOTE — Telephone Encounter (Signed)
Patient is aware 

## 2018-02-28 NOTE — Telephone Encounter (Signed)
CBC, met 7, lipid, TSH

## 2018-02-28 NOTE — Telephone Encounter (Signed)
Last labs T4, TSH, and Bmet on 02/13/17. Please advise. Thank you.

## 2018-03-09 DIAGNOSIS — R5383 Other fatigue: Secondary | ICD-10-CM | POA: Diagnosis not present

## 2018-03-09 DIAGNOSIS — Z79899 Other long term (current) drug therapy: Secondary | ICD-10-CM | POA: Diagnosis not present

## 2018-03-09 DIAGNOSIS — E7849 Other hyperlipidemia: Secondary | ICD-10-CM | POA: Diagnosis not present

## 2018-03-10 LAB — CBC WITH DIFFERENTIAL/PLATELET
BASOS: 1 %
Basophils Absolute: 0 10*3/uL (ref 0.0–0.2)
EOS (ABSOLUTE): 0.1 10*3/uL (ref 0.0–0.4)
Eos: 3 %
HEMATOCRIT: 38.1 % (ref 34.0–46.6)
Hemoglobin: 12.5 g/dL (ref 11.1–15.9)
Immature Grans (Abs): 0 10*3/uL (ref 0.0–0.1)
Immature Granulocytes: 0 %
LYMPHS ABS: 0.9 10*3/uL (ref 0.7–3.1)
Lymphs: 20 %
MCH: 30.9 pg (ref 26.6–33.0)
MCHC: 32.8 g/dL (ref 31.5–35.7)
MCV: 94 fL (ref 79–97)
MONOS ABS: 0.5 10*3/uL (ref 0.1–0.9)
Monocytes: 10 %
NEUTROS ABS: 3.1 10*3/uL (ref 1.4–7.0)
Neutrophils: 66 %
Platelets: 335 10*3/uL (ref 150–450)
RBC: 4.05 x10E6/uL (ref 3.77–5.28)
RDW: 11.2 % — AB (ref 12.3–15.4)
WBC: 4.6 10*3/uL (ref 3.4–10.8)

## 2018-03-10 LAB — BASIC METABOLIC PANEL
BUN / CREAT RATIO: 13 (ref 9–23)
BUN: 10 mg/dL (ref 6–24)
CHLORIDE: 101 mmol/L (ref 96–106)
CO2: 25 mmol/L (ref 20–29)
CREATININE: 0.78 mg/dL (ref 0.57–1.00)
Calcium: 9.5 mg/dL (ref 8.7–10.2)
GFR, EST AFRICAN AMERICAN: 100 mL/min/{1.73_m2} (ref >59)
GFR, EST NON AFRICAN AMERICAN: 86 mL/min/{1.73_m2} (ref >59)
Glucose: 84 mg/dL (ref 65–99)
Potassium: 4.4 mmol/L (ref 3.5–5.2)
Sodium: 140 mmol/L (ref 134–144)

## 2018-03-10 LAB — LIPID PANEL
Chol/HDL Ratio: 2.5 ratio (ref 0.0–4.4)
Cholesterol, Total: 181 mg/dL (ref 100–199)
HDL: 72 mg/dL (ref >39)
LDL Calculated: 93 mg/dL (ref 0–99)
Triglycerides: 78 mg/dL (ref 0–149)
VLDL Cholesterol Cal: 16 mg/dL (ref 5–40)

## 2018-03-10 LAB — TSH: TSH: 0.664 u[IU]/mL (ref 0.450–4.500)

## 2018-03-20 ENCOUNTER — Ambulatory Visit (INDEPENDENT_AMBULATORY_CARE_PROVIDER_SITE_OTHER): Payer: 59 | Admitting: Family Medicine

## 2018-03-20 ENCOUNTER — Encounter: Payer: Self-pay | Admitting: Family Medicine

## 2018-03-20 ENCOUNTER — Encounter: Payer: 59 | Admitting: Family Medicine

## 2018-03-20 VITALS — BP 126/74 | Ht 66.0 in | Wt 132.2 lb

## 2018-03-20 DIAGNOSIS — G4700 Insomnia, unspecified: Secondary | ICD-10-CM

## 2018-03-20 DIAGNOSIS — F321 Major depressive disorder, single episode, moderate: Secondary | ICD-10-CM | POA: Diagnosis not present

## 2018-03-20 DIAGNOSIS — Z0001 Encounter for general adult medical examination with abnormal findings: Secondary | ICD-10-CM

## 2018-03-20 DIAGNOSIS — F419 Anxiety disorder, unspecified: Secondary | ICD-10-CM

## 2018-03-20 DIAGNOSIS — Z1211 Encounter for screening for malignant neoplasm of colon: Secondary | ICD-10-CM | POA: Diagnosis not present

## 2018-03-20 DIAGNOSIS — J310 Chronic rhinitis: Secondary | ICD-10-CM

## 2018-03-20 DIAGNOSIS — J329 Chronic sinusitis, unspecified: Secondary | ICD-10-CM | POA: Diagnosis not present

## 2018-03-20 MED ORDER — ALPRAZOLAM 1 MG PO TABS: ORAL_TABLET | ORAL | 5 refills | Status: DC

## 2018-03-20 MED ORDER — DOXYCYCLINE HYCLATE 100 MG PO TABS: 100.0000 mg | ORAL_TABLET | Freq: Two times a day (BID) | ORAL | 0 refills | Status: DC

## 2018-03-20 MED ORDER — DULOXETINE HCL 30 MG PO CPEP: 30.0000 mg | ORAL_CAPSULE | Freq: Every day | ORAL | 1 refills | Status: DC

## 2018-03-20 NOTE — Patient Instructions (Addendum)
Start taking 1/2 tablet of zoloft for the next 3 days then stop completely and start taking the cymbalta daily.  Let us know how you are doing with this. You should follow up in 4-6 weeks.    Sinusitis, Adult Sinusitis is soreness and inflammation of your sinuses. Sinuses are hollow spaces in the bones around your face. Your sinuses are located:  Around your eyes.  In the middle of your forehead.  Behind your nose.  In your cheekbones.  Your sinuses and nasal passages are lined with a stringy fluid (mucus). Mucus normally drains out of your sinuses. When your nasal tissues become inflamed or swollen, the mucus can become trapped or blocked so air cannot flow through your sinuses. This allows bacteria, viruses, and funguses to grow, which leads to infection. Sinusitis can develop quickly and last for 7?10 days (acute) or for more than 12 weeks (chronic). Sinusitis often develops after a cold. What are the causes? This condition is caused by anything that creates swelling in the sinuses or stops mucus from draining, including:  Allergies.  Asthma.  Bacterial or viral infection.  Abnormally shaped bones between the nasal passages.  Nasal growths that contain mucus (nasal polyps).  Narrow sinus openings.  Pollutants, such as chemicals or irritants in the air.  A foreign object stuck in the nose.  A fungal infection. This is rare.  What increases the risk? The following factors may make you more likely to develop this condition:  Having allergies or asthma.  Having had a recent cold or respiratory tract infection.  Having structural deformities or blockages in your nose or sinuses.  Having a weak immune system.  Doing a lot of swimming or diving.  Overusing nasal sprays.  Smoking.  What are the signs or symptoms? The main symptoms of this condition are pain and a feeling of pressure around the affected sinuses. Other symptoms include:  Upper  toothache.  Earache.  Headache.  Bad breath.  Decreased sense of smell and taste.  A cough that may get worse at night.  Fatigue.  Fever.  Thick drainage from your nose. The drainage is often green and it may contain pus (purulent).  Stuffy nose or congestion.  Postnasal drip. This is when extra mucus collects in the throat or back of the nose.  Swelling and warmth over the affected sinuses.  Sore throat.  Sensitivity to light.  How is this diagnosed? This condition is diagnosed based on symptoms, a medical history, and a physical exam. To find out if your condition is acute or chronic, your health care provider may:  Look in your nose for signs of nasal polyps.  Tap over the affected sinus to check for signs of infection.  View the inside of your sinuses using an imaging device that has a light attached (endoscope).  If your health care provider suspects that you have chronic sinusitis, you may also:  Be tested for allergies.  Have a sample of mucus taken from your nose (nasal culture) and checked for bacteria.  Have a mucus sample examined to see if your sinusitis is related to an allergy.  If your sinusitis does not respond to treatment and it lasts longer than 8 weeks, you may have an MRI or CT scan to check your sinuses. These scans also help to determine how severe your infection is. In rare cases, a bone biopsy may be done to rule out more serious types of fungal sinus disease. How is this treated? Treatment for  sinusitis depends on the cause and whether your condition is chronic or acute. If a virus is causing your sinusitis, your symptoms will go away on their own within 10 days. You may be given medicines to relieve your symptoms, including:  Topical nasal decongestants. They shrink swollen nasal passages and let mucus drain from your sinuses.  Antihistamines. These drugs block inflammation that is triggered by allergies. This can help to ease swelling in  your nose and sinuses.  Topical nasal corticosteroids. These are nasal sprays that ease inflammation and swelling in your nose and sinuses.  Nasal saline washes. These rinses can help to get rid of thick mucus in your nose.  If your condition is caused by bacteria, you will be given an antibiotic medicine. If your condition is caused by a fungus, you will be given an antifungal medicine. Surgery may be needed to correct underlying conditions, such as narrow nasal passages. Surgery may also be needed to remove polyps. Follow these instructions at home: Medicines  Take, use, or apply over-the-counter and prescription medicines only as told by your health care provider. These may include nasal sprays.  If you were prescribed an antibiotic medicine, take it as told by your health care provider. Do not stop taking the antibiotic even if you start to feel better. Hydrate and Humidify  Drink enough water to keep your urine clear or pale yellow. Staying hydrated will help to thin your mucus.  Use a cool mist humidifier to keep the humidity level in your home above 50%.  Inhale steam for 10-15 minutes, 3-4 times a day or as told by your health care provider. You can do this in the bathroom while a hot shower is running.  Limit your exposure to cool or dry air. Rest  Rest as much as possible.  Sleep with your head raised (elevated).  Make sure to get enough sleep each night. General instructions  Apply a warm, moist washcloth to your face 3-4 times a day or as told by your health care provider. This will help with discomfort.  Wash your hands often with soap and water to reduce your exposure to viruses and other germs. If soap and water are not available, use hand sanitizer.  Do not smoke. Avoid being around people who are smoking (secondhand smoke).  Keep all follow-up visits as told by your health care provider. This is important. Contact a health care provider if:  You have a  fever.  Your symptoms get worse.  Your symptoms do not improve within 10 days. Get help right away if:  You have a severe headache.  You have persistent vomiting.  You have pain or swelling around your face or eyes.  You have vision problems.  You develop confusion.  Your neck is stiff.  You have trouble breathing. This information is not intended to replace advice given to you by your health care provider. Make sure you discuss any questions you have with your health care provider. Document Released: 03/28/2005 Document Revised: 11/22/2015 Document Reviewed: 01/21/2015 Elsevier Interactive Patient Education  [employer].

## 2018-03-20 NOTE — Progress Notes (Signed)
Subjective:    Patient ID: Rebekah Andrews, female    DOB: Jan 04, 1966, 52 y.o.   MRN: [id number]  HPI The patient comes in today for a wellness visit.  Pt states she sees gyn for pelvic and breast exams. Being seen in [place], last seen 2 months ago.  Just needs bloodwork done today.   A review of their health history was completed. A review of medications was also completed.  Any needed refills; none  Eating habits: health conscious  Falls/  MVA accidents in past few months: none  Regular exercise: walking everyday  Specialist pt sees on regular basis: GYN  Preventative health issues were discussed. Has not had colonoscopy done.   Additional concerns: Sinus drainage for the past week and a half. Reports cough productive of yellow mucous. Slight sore throat and pressure to right ear. No fever. No shortness of breath, has not needed inhaler. Tried mucinex dm. Pt states zpak normally works best for her.   Depression/Anxiety: Restarted zoloft 3 weeks ago, feels like it's helpful for her anxiety but makes her feel not quite herself, states she's not as talkative as she normally is and reports side effects of decreased libido. Taking xanax nightly for sleep instead of the ambien cr. Denies taking during the daytime.   Hysterectomy, Denies any problems, sexually active with 1 partner.   Review of Systems  Constitutional: Negative for chills, fatigue, fever and unexpected weight change.  HENT: Negative for congestion, ear pain, sinus pressure, sinus pain and sore throat.   Eyes: Negative for discharge and visual disturbance.  Respiratory: Negative for cough, shortness of breath and wheezing.   Cardiovascular: Negative for chest pain and leg swelling.  Gastrointestinal: Negative for abdominal pain, blood in stool, constipation, diarrhea, nausea and vomiting.  Genitourinary: Negative for difficulty urinating, hematuria, vaginal discharge and vaginal pain.  Neurological: Negative for  dizziness, weakness, light-headedness and headaches.  Psychiatric/Behavioral: Negative for suicidal ideas.  All other systems reviewed and are negative.      Objective:   Physical Exam  Constitutional: She is oriented to person, place, and time. She appears well-developed and well-nourished. No distress.  HENT:  Head: Normocephalic and atraumatic.  Right Ear: Tympanic membrane normal.  Left Ear: Tympanic membrane normal.  Nose: Nose normal.  Mouth/Throat: Uvula is midline and oropharynx is clear and moist.  Eyes: Pupils are equal, round, and reactive to light. Conjunctivae and EOM are normal. Right eye exhibits no discharge. Left eye exhibits no discharge.  Neck: Neck supple. No thyromegaly present.  Cardiovascular: Normal rate, regular rhythm and normal heart sounds.  No murmur heard. Pulmonary/Chest: Effort normal and breath sounds normal. No respiratory distress. She has no wheezes.  Abdominal: Soft. Bowel sounds are normal. She exhibits no distension and no mass. There is no tenderness.  Genitourinary:  Genitourinary Comments: Defers breast or GU exam, gets these done at GYN office. Denies any problems.   Musculoskeletal: She exhibits no edema or deformity.  Lymphadenopathy:    She has no cervical adenopathy.  Neurological: She is alert and oriented to person, place, and time. Coordination normal.  Skin: Skin is warm and dry.  Psychiatric: She has a normal mood and affect.  Nursing note and vitals reviewed.  Depression screen [facility] 2/9 03/20/2018 02/13/2017  Decreased Interest 1 2  Down, Depressed, Hopeless 2 2  PHQ - 2 Score 3 4  Altered sleeping 3 3  Tired, decreased energy 2 3  Change in appetite 2 2  Feeling bad  or failure about yourself  1 1  Trouble concentrating 1 2  Moving slowly or fidgety/restless 1 1  Suicidal thoughts 0 0  PHQ-9 Score 13 16  Difficult doing work/chores Somewhat difficult -   GAD 7 : Generalized Anxiety Score 03/20/2018 02/13/2017  Nervous,  Anxious, on Edge 2 2  Control/stop worrying 2 2  Worry too much - different things 2 3  Trouble relaxing 2 2  Restless 2 3  Easily annoyed or irritable 2 3  Afraid - awful might happen 2 3  Total GAD 7 Score 14 18  Anxiety Difficulty Somewhat difficult Somewhat difficult    Results for orders placed or performed in visit on 02/28/18  CBC with Differential/Platelet  Result Value Ref Range   WBC 4.6 3.4 - 10.8 x10E3/uL   RBC 4.05 3.77 - 5.28 x10E6/uL   Hemoglobin 12.5 11.1 - 15.9 g/dL   Hematocrit 16.138.1 09.634.0 - 46.6 %   MCV 94 79 - 97 fL   MCH 30.9 26.6 - 33.0 pg   MCHC 32.8 31.5 - 35.7 g/dL   RDW 04.511.2 (L) 40.912.3 - 81.115.4 %   Platelets 335 150 - 450 x10E3/uL   Neutrophils 66 Not Estab. %   Lymphs 20 Not Estab. %   Monocytes 10 Not Estab. %   Eos 3 Not Estab. %   Basos 1 Not Estab. %   Neutrophils Absolute 3.1 1.4 - 7.0 x10E3/uL   Lymphocytes Absolute 0.9 0.7 - 3.1 x10E3/uL   Monocytes Absolute 0.5 0.1 - 0.9 x10E3/uL   EOS (ABSOLUTE) 0.1 0.0 - 0.4 x10E3/uL   Basophils Absolute 0.0 0.0 - 0.2 x10E3/uL   Immature Granulocytes 0 Not Estab. %   Immature Grans (Abs) 0.0 0.0 - 0.1 x10E3/uL  Basic metabolic panel  Result Value Ref Range   Glucose 84 65 - 99 mg/dL   BUN 10 6 - 24 mg/dL   Creatinine, Ser 9.140.78 0.57 - 1.00 mg/dL   GFR calc non Af Amer 86 >59 mL/min/1.73   GFR calc Af Amer 100 >59 mL/min/1.73   BUN/Creatinine Ratio 13 9 - 23   Sodium 140 134 - 144 mmol/L   Potassium 4.4 3.5 - 5.2 mmol/L   Chloride 101 96 - 106 mmol/L   CO2 25 20 - 29 mmol/L   Calcium 9.5 8.7 - 10.2 mg/dL  Lipid panel  Result Value Ref Range   Cholesterol, Total 181 100 - 199 mg/dL   Triglycerides 78 0 - 149 mg/dL   HDL 72 >78>39 mg/dL   VLDL Cholesterol Cal 16 5 - 40 mg/dL   LDL Calculated 93 0 - 99 mg/dL   Chol/HDL Ratio 2.5 0.0 - 4.4 ratio  TSH  Result Value Ref Range   TSH 0.664 0.450 - 4.500 uIU/mL          Assessment & Plan:  1. Encounter for well adult exam with abnormal findings Adult  wellness-complete.wellness physical was conducted today. Importance of diet and exercise were discussed in detail.  In addition to this a discussion regarding safety was also covered. We also reviewed over immunizations and gave recommendations regarding current immunization needed for age.  In addition to this additional areas were also touched on including: Preventative health exams needed:  Colonoscopy is needed, referral placed for scheduling. Discussed with pt importance of getting this done. Mammogram scheduled for next week.  Pap Smear: done at pt's GYN office per patient.  Patient was advised yearly wellness exam. Recent lab work reviewed with patient.  2. Rhinosinusitis Discussed likely post-viral sinusitis. Will go ahead and treat with doxycycline. Symptomatic care discussed. F/u if symptoms worsen or fail to improve.  3. Current moderate episode of major depressive disorder without prior episode (HCC) PHQ 9 and GAD 7 reviewed with patient. She is not suicidal. Reports zoloft is somewhat helpful but having side effects and she would like to consider changing medication. Has tried wellbutrin in the past but was not helpful for her anxiety. Recommended either a trial of wellbutrin plus buspar, or a trial of cymbalta daily. Pt prefers to try the cymbalta. Discussed full benefit not seen til after 8 weeks usually. She should f/u in 4-6 weeks to see how she is doing on this medication. Discussed if she develops SI she should seek emergency care, pt verbalized understanding.   4. Anxiety See #3 plan.   5. Insomnia Pt has rx for ambien CR and xanax for sleep, states she only takes one or the other but does not like the [place] CR, states the regular [place] she had episodes of sleep walking with. States she has been taking the xanax, 1 tablet just at nighttime to help with sleep and would like to continue this. Discussed we will d/c the ambien CR and just rx the xanax. Pt is agreeable with  this plan. PMP database checked.   6. Encounter for screening colonoscopy - Plan: Ambulatory referral to Gastroenterology  Dr. Lilyan Punt was consulted on this case and is in agreement with the above treatment plan.  25 minutes was spent with the patient.  This statement verifies that 25 minutes was indeed spent with the patient.  More than 50% of this visit-total duration of the visit-was spent in counseling and coordination of care. The issues that the patient came in for today as reflected in the diagnosis (s) please refer to documentation for further details.

## 2018-03-22 ENCOUNTER — Other Ambulatory Visit: Payer: Self-pay | Admitting: Family Medicine

## 2018-03-22 NOTE — Telephone Encounter (Signed)
Please call the pharmacy tell them that we have discontinued this medication.  That way they do not send us further requests Please also make sure that it is not on epic list

## 2018-03-23 ENCOUNTER — Encounter: Payer: Self-pay | Admitting: Family Medicine

## 2018-03-23 NOTE — Telephone Encounter (Signed)
Contacted pharmacy and informed them that this med was to be cancelled. Pharmacist tech stated she removed medication. This med is not on Epic.

## 2018-03-26 ENCOUNTER — Encounter: Payer: Self-pay | Admitting: Internal Medicine

## 2018-03-26 ENCOUNTER — Ambulatory Visit (HOSPITAL_COMMUNITY): Payer: 59

## 2018-03-30 ENCOUNTER — Ambulatory Visit (HOSPITAL_COMMUNITY)
Admission: RE | Admit: 2018-03-30 | Discharge: 2018-03-30 | Disposition: A | Discharge status: Home / Self Care | Payer: 59 | Source: Ambulatory Visit | Attending: Family Medicine | Admitting: Family Medicine

## 2018-03-30 ENCOUNTER — Encounter (HOSPITAL_COMMUNITY): Payer: Self-pay

## 2018-03-30 DIAGNOSIS — Z1231 Encounter for screening mammogram for malignant neoplasm of breast: Secondary | ICD-10-CM | POA: Insufficient documentation

## 2018-06-12 ENCOUNTER — Ambulatory Visit: Payer: 59 | Admitting: Nurse Practitioner

## 2018-06-14 ENCOUNTER — Telehealth: Payer: Self-pay | Admitting: Family Medicine

## 2018-06-14 ENCOUNTER — Other Ambulatory Visit: Payer: Self-pay | Admitting: Family Medicine

## 2018-06-14 NOTE — Telephone Encounter (Signed)
Left message to return call 

## 2018-06-14 NOTE — Telephone Encounter (Signed)
Patients wants to be switched from Cymbalta back to Wellbutrin

## 2018-06-14 NOTE — Telephone Encounter (Signed)
Patient would like to be placed back on buPROPion (WELLBUTRIN SR) 150 MG 12 hr tablet instead of doxycycline (VIBRA-TABS) 100 MG tablet    Pharmacy:  [id number] - [place], [place] - [address] AT [facility]

## 2018-06-14 NOTE — Telephone Encounter (Signed)
Recommend she schedule f/u ov to further discuss. Thanks

## 2018-06-15 NOTE — Telephone Encounter (Signed)
Patient notified and scheduled follow up office visit. 

## 2018-06-16 NOTE — Telephone Encounter (Signed)
May have this +4 refills 

## 2018-06-18 DIAGNOSIS — N951 Menopausal and female climacteric states: Secondary | ICD-10-CM | POA: Diagnosis not present

## 2018-06-18 DIAGNOSIS — F519 Sleep disorder not due to a substance or known physiological condition, unspecified: Secondary | ICD-10-CM | POA: Diagnosis not present

## 2018-06-20 ENCOUNTER — Ambulatory Visit: Payer: 59 | Admitting: Family Medicine

## 2018-08-03 ENCOUNTER — Ambulatory Visit (INDEPENDENT_AMBULATORY_CARE_PROVIDER_SITE_OTHER): Payer: BLUE CROSS/BLUE SHIELD | Admitting: Family Medicine

## 2018-08-03 ENCOUNTER — Other Ambulatory Visit: Payer: Self-pay

## 2018-08-03 DIAGNOSIS — M79671 Pain in right foot: Secondary | ICD-10-CM

## 2018-08-03 DIAGNOSIS — S9031XA Contusion of right foot, initial encounter: Secondary | ICD-10-CM

## 2018-08-03 NOTE — Progress Notes (Signed)
   Subjective:  Format audio and video Patient present at home Provider present at office Consent for interaction obtained Coronavirus outbreak   Patient ID: Rebekah Andrews, female    DOB: 1965-07-05, 53 y.o.   MRN: [id number]  HPI   Patient states she has 2 purple swollen toes on right foot- no know injury. Patient states it started out of the blue a few days ago. Patient is concerned about COVID Toes. 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   The patient denies any fever chills sweats denies any chest tightness pressure pain denies weight loss She states that she had a bruised area on the right side of her foot near the distal toe she showed it to her children who told her that they felt she had COVID  and so therefore she was worried about the possibility of having this infection going on   Virtual Visit via Video Note  I connected with Rebekah Andrews on 08/03/18 at  3:50 PM EDT by a video enabled telemedicine application and verified that I am speaking with the correct person using two identifiers.   I discussed the limitations of evaluation and management by telemedicine and the availability of in person appointments. The patient expressed understanding and agreed to proceed.  History of Present Illness:    Observations/Objective:   Assessment and Plan:   Follow Up Instructions:    I discussed the assessment and treatment plan with the patient. The patient was provided an opportunity to ask questions and all were answered. The patient agreed with the plan and demonstrated an understanding of the instructions.   The patient was advised to call back or seek an in-person evaluation if the symptoms worsen or if the condition fails to improve as anticipated.  I provided 15 minutes of non-face-to-face time during this encounter.    The patient was able to send me pictures of her feet through  cell phone technology I looked at these closely and it appears that it is more likely bruising at the MTP joint of the fifth digit rather than true COVID toes  So therefore we did discuss how it may be the best approach for the patient to stay away from her mother over the course of the next 7 days to make sure that she is not having any fever sweats chills body aches.  If she is not having any of these she may resume caring for her grandmother.  In the meantime she will self monitor herself for any symptoms.  I believe that this is mainly bruising.    Review of Systems     Objective:   Physical Exam        Assessment & Plan:  Please see discussion above Foot bruising Should gradually get better over time Certainly notify us if any problems

## 2018-09-25 ENCOUNTER — Other Ambulatory Visit: Payer: Self-pay | Admitting: *Deleted

## 2018-09-25 MED ORDER — ALPRAZOLAM 1 MG PO TABS: ORAL_TABLET | ORAL | 1 refills | Status: DC

## 2018-09-25 NOTE — Telephone Encounter (Signed)
[facility] 

## 2018-09-25 NOTE — Telephone Encounter (Signed)
Virtual follow-up visit this summer May have this +1 refill

## 2018-10-03 ENCOUNTER — Telehealth: Payer: Self-pay | Admitting: Family Medicine

## 2018-10-03 ENCOUNTER — Other Ambulatory Visit: Payer: Self-pay | Admitting: Nurse Practitioner

## 2018-10-03 MED ORDER — FLUCONAZOLE 150 MG PO TABS: ORAL_TABLET | ORAL | 0 refills | Status: DC

## 2018-10-03 NOTE — Telephone Encounter (Signed)
Done

## 2018-10-03 NOTE — Telephone Encounter (Signed)
Pt is requesting [facility] be [id number] - [place], [place].  She is having itching, burning and discharge that all started yesterday. NO fever.

## 2018-12-13 ENCOUNTER — Other Ambulatory Visit: Payer: Self-pay | Admitting: Family Medicine

## 2018-12-13 NOTE — Telephone Encounter (Signed)
[facility] requesting refill on Zolpidem Tartrate ER Tablets 12.5 mg. Requesting 90 day supply.  Take one tablet by mouth at bedtime prn. Pt last seen 08/03/2018 for bruised sole of foot. Please advise. Thank you

## 2018-12-13 NOTE — Telephone Encounter (Signed)
[facility] also requesting 90 day supply of Alprazolam 1 mg tablets. Take one tablet by mouth at bedtime prn. Please advise. Thank you

## 2018-12-14 ENCOUNTER — Other Ambulatory Visit: Payer: Self-pay | Admitting: Family Medicine

## 2018-12-14 MED ORDER — ALPRAZOLAM 1 MG PO TABS: ORAL_TABLET | ORAL | 0 refills | Status: DC

## 2018-12-14 MED ORDER — ZOLPIDEM TARTRATE ER 12.5 MG PO TBCR: 12.5000 mg | EXTENDED_RELEASE_TABLET | Freq: Every evening | ORAL | 0 refills | Status: DC | PRN

## 2018-12-14 NOTE — Telephone Encounter (Signed)
If you are able to do this and pen the order to where I can do the prescription that is fine with me for 90 of each patient will need follow-up virtual visit within the next 60 days

## 2018-12-14 NOTE — Addendum Note (Signed)
Addended by: Carmelina Noun on: 12/14/2018 03:04 PM   Modules accepted: Orders

## 2018-12-14 NOTE — Telephone Encounter (Signed)
Refills pended.

## 2018-12-16 ENCOUNTER — Other Ambulatory Visit: Payer: Self-pay | Admitting: Family Medicine

## 2018-12-19 NOTE — Telephone Encounter (Signed)
Patient will need virtual follow-up for this fall September or October then I can do the refill

## 2018-12-21 NOTE — Telephone Encounter (Signed)
Left message to schedule appointment

## 2019-01-09 NOTE — Telephone Encounter (Signed)
[facility] - need to schedule med check  °

## 2019-01-15 NOTE — Telephone Encounter (Signed)
Multiple messages left for patient without return call. Can we decline rx to pharmacy?

## 2019-03-20 ENCOUNTER — Telehealth: Payer: Self-pay | Admitting: Family Medicine

## 2019-03-20 MED ORDER — VALACYCLOVIR HCL 500 MG PO TABS: 500.0000 mg | ORAL_TABLET | Freq: Every day | ORAL | 5 refills | Status: DC

## 2019-03-20 NOTE — Telephone Encounter (Signed)
May have 6 refills 

## 2019-03-20 NOTE — Telephone Encounter (Signed)
Pt would like refill on valACYclovir (VALTREX) 500 MG tablet for fever blisters.   [id number] [place], [place]

## 2019-03-20 NOTE — Telephone Encounter (Signed)
Prescription sent electronically to pharmacy. Patient notified. 

## 2019-04-22 ENCOUNTER — Telehealth: Payer: Self-pay | Admitting: Family Medicine

## 2019-04-22 MED ORDER — ALPRAZOLAM 1 MG PO TABS: ORAL_TABLET | ORAL | 0 refills | Status: DC

## 2019-04-22 NOTE — Telephone Encounter (Signed)
Alprazolam 1 mg, #90, take 1 each evening as needed insomnia Please go ahead and pend the prescription thank you

## 2019-04-22 NOTE — Telephone Encounter (Signed)
Med sent to pharm and I left a message for pt to return call to notify her

## 2019-04-22 NOTE — Telephone Encounter (Signed)
ALPRAZolam (XANAX) 1 MG tablet     [facility]  *[facility] said that [facility] was [facility] to be sending [place] a fax but she is out so prefers a Insurance claims handler.

## 2019-04-22 NOTE — Telephone Encounter (Signed)
Med pended. Please send back after signing so I can notify pt. thanks

## 2019-04-22 NOTE — Telephone Encounter (Signed)
Prescription printed which I was not aware of-but nonetheless I signed it please fax it

## 2019-04-23 NOTE — Telephone Encounter (Signed)
Pt.notified

## 2019-04-25 ENCOUNTER — Other Ambulatory Visit: Payer: Self-pay

## 2019-04-25 ENCOUNTER — Ambulatory Visit: Payer: BC Managed Care – PPO | Attending: Internal Medicine

## 2019-04-25 DIAGNOSIS — Z20822 Contact with and (suspected) exposure to covid-19: Secondary | ICD-10-CM | POA: Diagnosis not present

## 2019-04-26 LAB — NOVEL CORONAVIRUS, NAA: SARS-CoV-2, NAA: NOT DETECTED

## 2019-06-26 ENCOUNTER — Other Ambulatory Visit (HOSPITAL_COMMUNITY): Payer: Self-pay | Admitting: Family Medicine

## 2019-06-26 DIAGNOSIS — Z1231 Encounter for screening mammogram for malignant neoplasm of breast: Secondary | ICD-10-CM

## 2019-07-08 ENCOUNTER — Other Ambulatory Visit: Payer: Self-pay

## 2019-07-08 ENCOUNTER — Ambulatory Visit (HOSPITAL_COMMUNITY)
Admission: RE | Admit: 2019-07-08 | Discharge: 2019-07-08 | Disposition: A | Discharge status: Home / Self Care | Payer: BC Managed Care – PPO | Source: Ambulatory Visit | Attending: Family Medicine | Admitting: Family Medicine

## 2019-07-08 DIAGNOSIS — Z1231 Encounter for screening mammogram for malignant neoplasm of breast: Secondary | ICD-10-CM | POA: Insufficient documentation

## 2019-07-28 ENCOUNTER — Other Ambulatory Visit: Payer: Self-pay | Admitting: Family Medicine

## 2019-07-29 NOTE — Telephone Encounter (Signed)
May have 1 refill Please pend Please also notify patient needs to do follow-up office visit in person or virtual

## 2019-07-29 NOTE — Telephone Encounter (Signed)
Scheduled 6/21

## 2019-07-31 ENCOUNTER — Telehealth: Payer: Self-pay | Admitting: Family Medicine

## 2019-07-31 MED ORDER — MAGIC MOUTHWASH: ORAL | 3 refills | Status: DC

## 2019-07-31 NOTE — Telephone Encounter (Signed)
Medication printed and awaiting signature; then will fax to pharmacy. Pt is aware

## 2019-07-31 NOTE — Telephone Encounter (Signed)
Miracle mouthwash or Duke's Magic mouthwash 1 teaspoon swish and spit 4 times daily for 7 days, 180 mL, 3 refills

## 2019-07-31 NOTE — Telephone Encounter (Signed)
Would like some miracle mouthwash called in for mouth sore.  [facility]

## 2019-07-31 NOTE — Telephone Encounter (Signed)
Please advise. Thank you

## 2019-08-02 ENCOUNTER — Other Ambulatory Visit: Payer: Self-pay | Admitting: *Deleted

## 2019-09-02 ENCOUNTER — Telehealth: Payer: Self-pay | Admitting: Family Medicine

## 2019-09-02 DIAGNOSIS — R5383 Other fatigue: Secondary | ICD-10-CM

## 2019-09-02 DIAGNOSIS — Z79899 Other long term (current) drug therapy: Secondary | ICD-10-CM

## 2019-09-02 DIAGNOSIS — E7849 Other hyperlipidemia: Secondary | ICD-10-CM

## 2019-09-02 NOTE — Telephone Encounter (Signed)
Last labs 03/09/18

## 2019-09-02 NOTE — Telephone Encounter (Signed)
Patient has physical on 10/16/19 and needing labs done.

## 2019-09-02 NOTE — Telephone Encounter (Signed)
Metabolic 7, lipid, liver, CBC

## 2019-09-02 NOTE — Telephone Encounter (Signed)
Blood work ordered in Epic. Telephone call-mailbox is full 

## 2019-09-03 NOTE — Telephone Encounter (Signed)
Pt.notified

## 2019-09-03 NOTE — Telephone Encounter (Signed)
T/c no answer.

## 2019-09-26 ENCOUNTER — Telehealth: Payer: Self-pay | Admitting: Family Medicine

## 2019-09-26 NOTE — Telephone Encounter (Signed)
Pt would like to know if Dr. Lorin Picket would add the covid antibody lab work to their orders.

## 2019-09-26 NOTE — Telephone Encounter (Signed)
Please add as requested can not guarentee insurance coverage for this test

## 2019-09-26 NOTE — Telephone Encounter (Signed)
Pt to see if they can get for free with blood donation and will call back if they want test added.

## 2019-09-27 ENCOUNTER — Other Ambulatory Visit: Payer: Self-pay | Admitting: *Deleted

## 2019-09-27 ENCOUNTER — Telehealth: Payer: Self-pay | Admitting: Family Medicine

## 2019-09-27 DIAGNOSIS — Z1152 Encounter for screening for COVID-19: Secondary | ICD-10-CM

## 2019-09-27 NOTE — Telephone Encounter (Signed)
Orders put in for covid antibody test. Tried to call pt and no answer. Need to let her know to make sure lab knows there are two separate orders in system.

## 2019-09-27 NOTE — Telephone Encounter (Signed)
See message from 6/17.   Insurance will cover lab work.  

## 2019-09-30 ENCOUNTER — Ambulatory Visit: Payer: BLUE CROSS/BLUE SHIELD | Admitting: Family Medicine

## 2019-09-30 NOTE — Telephone Encounter (Signed)
Lmtc

## 2019-09-30 NOTE — Telephone Encounter (Signed)
*  Tc/vm full

## 2019-10-02 ENCOUNTER — Encounter: Payer: Self-pay | Admitting: *Deleted

## 2019-10-02 NOTE — Telephone Encounter (Signed)
Sent mychart message notifying pt about lab work.  

## 2019-10-07 ENCOUNTER — Telehealth: Payer: Self-pay | Admitting: Family Medicine

## 2019-10-07 NOTE — Telephone Encounter (Signed)
Lmtc

## 2019-10-07 NOTE — Telephone Encounter (Signed)
Patient would like a prescription called in for hemorrhoid because the over the counter is not helping.[facility]

## 2019-10-07 NOTE — Telephone Encounter (Signed)
Nurses- Hemorrhoids obviously can be painful and frustrating for the patient. Typically initial treatments or topicals along with some other approaches so therefore at this point in order to know how to advise the patient we need to know the following #1 how long is patient been dealing with this? #2 any bleeding issues? #3 what is the patient tried so far? #4 has patient ever had issues to the extent of having to see a surgeon? #5 is the current hemorrhoid small or large?

## 2019-10-08 ENCOUNTER — Other Ambulatory Visit: Payer: Self-pay | Admitting: Family Medicine

## 2019-10-08 MED ORDER — PRAMOXINE-HC 1-1 % EX CREA: TOPICAL_CREAM | CUTANEOUS | 1 refills | Status: DC

## 2019-10-08 NOTE — Telephone Encounter (Signed)
Prescription topical was sent to [facility] as requested may use this up to 3 times daily.  Also may do warm sitz bath's.,  May also do witch hazel pads as needed, taking a stool softener to keep bowel movements soft is advisable as well Doing the above should help over time If progressive troubles or worse may need referral to general surgery to have procedure done

## 2019-10-08 NOTE — Telephone Encounter (Signed)
Patient notified and verbalized understanding. 

## 2019-10-08 NOTE — Telephone Encounter (Signed)
Patient states this particular hemorrhoid has been bothering her for 7 days. She has had issues in the past and has had to have them lanced a long time ago/. No bleeding just irritated and swollen, this one is not as big as she has had in the past but still a problem. She has tried preparation H, Tux pads and anusol suppositories over the counter.

## 2019-10-09 ENCOUNTER — Telehealth: Payer: Self-pay | Admitting: *Deleted

## 2019-10-09 ENCOUNTER — Other Ambulatory Visit: Payer: Self-pay | Admitting: Family Medicine

## 2019-10-09 LAB — BASIC METABOLIC PANEL
BUN/Creatinine Ratio: 13 (ref 9–23)
BUN: 9 mg/dL (ref 6–24)
CO2: 25 mmol/L (ref 20–29)
Calcium: 9.1 mg/dL (ref 8.7–10.2)
Chloride: 103 mmol/L (ref 96–106)
Creatinine, Ser: 0.7 mg/dL (ref 0.57–1.00)
GFR calc Af Amer: 112 mL/min/{1.73_m2} (ref >59)
GFR calc non Af Amer: 97 mL/min/{1.73_m2} (ref >59)
Glucose: 80 mg/dL (ref 65–99)
Potassium: 4.3 mmol/L (ref 3.5–5.2)
Sodium: 141 mmol/L (ref 134–144)

## 2019-10-09 LAB — CBC WITH DIFFERENTIAL/PLATELET
Basophils Absolute: 0 10*3/uL (ref 0.0–0.2)
Basos: 1 %
EOS (ABSOLUTE): 0.2 10*3/uL (ref 0.0–0.4)
Eos: 5 %
Hematocrit: 37.5 % (ref 34.0–46.6)
Hemoglobin: 12.5 g/dL (ref 11.1–15.9)
Immature Grans (Abs): 0 10*3/uL (ref 0.0–0.1)
Immature Granulocytes: 0 %
Lymphocytes Absolute: 1.2 10*3/uL (ref 0.7–3.1)
Lymphs: 27 %
MCH: 31.6 pg (ref 26.6–33.0)
MCHC: 33.3 g/dL (ref 31.5–35.7)
MCV: 95 fL (ref 79–97)
Monocytes Absolute: 0.4 10*3/uL (ref 0.1–0.9)
Monocytes: 10 %
Neutrophils Absolute: 2.5 10*3/uL (ref 1.4–7.0)
Neutrophils: 57 %
Platelets: 283 10*3/uL (ref 150–450)
RBC: 3.96 x10E6/uL (ref 3.77–5.28)
RDW: 11.7 % (ref 11.7–15.4)
WBC: 4.4 10*3/uL (ref 3.4–10.8)

## 2019-10-09 LAB — HEPATIC FUNCTION PANEL
ALT: 9 IU/L (ref 0–32)
AST: 14 IU/L (ref 0–40)
Albumin: 4.3 g/dL (ref 3.8–4.9)
Alkaline Phosphatase: 60 IU/L (ref 48–121)
Bilirubin Total: 1 mg/dL (ref 0.0–1.2)
Bilirubin, Direct: 0.2 mg/dL (ref 0.00–0.40)
Total Protein: 7.1 g/dL (ref 6.0–8.5)

## 2019-10-09 LAB — SAR COV2 SEROLOGY (COVID19)AB(IGG),IA: DiaSorin SARS-CoV-2 Ab, IgG: POSITIVE

## 2019-10-09 LAB — LIPID PANEL
Chol/HDL Ratio: 3.3 ratio (ref 0.0–4.4)
Cholesterol, Total: 202 mg/dL — ABNORMAL HIGH (ref 100–199)
HDL: 61 mg/dL (ref >39)
LDL Chol Calc (NIH): 124 mg/dL — ABNORMAL HIGH (ref 0–99)
Triglycerides: 97 mg/dL (ref 0–149)
VLDL Cholesterol Cal: 17 mg/dL (ref 5–40)

## 2019-10-09 MED ORDER — HYDROCORT-PRAMOXINE (PERIANAL) 2.5-1 % EX CREA: 1.0000 "application " | TOPICAL_CREAM | Freq: Three times a day (TID) | CUTANEOUS | 3 refills | Status: DC

## 2019-10-09 NOTE — Telephone Encounter (Signed)
[facility] needs new script sent in for Analpram with strength verified

## 2019-10-09 NOTE — Telephone Encounter (Signed)
Prescription prescription sent in as requested

## 2019-10-16 ENCOUNTER — Encounter: Payer: Self-pay | Admitting: Family Medicine

## 2019-10-16 ENCOUNTER — Other Ambulatory Visit: Payer: Self-pay

## 2019-10-16 ENCOUNTER — Ambulatory Visit (INDEPENDENT_AMBULATORY_CARE_PROVIDER_SITE_OTHER): Payer: 59 | Admitting: Family Medicine

## 2019-10-16 ENCOUNTER — Other Ambulatory Visit: Payer: Self-pay | Admitting: *Deleted

## 2019-10-16 VITALS — BP 116/70 | Temp 97.3°F | Ht 66.0 in | Wt 134.0 lb

## 2019-10-16 DIAGNOSIS — Z0000 Encounter for general adult medical examination without abnormal findings: Secondary | ICD-10-CM | POA: Diagnosis not present

## 2019-10-16 DIAGNOSIS — Z1211 Encounter for screening for malignant neoplasm of colon: Secondary | ICD-10-CM

## 2019-10-16 MED ORDER — ZOSTER VAC RECOMB ADJUVANTED 50 MCG/0.5ML IM SUSR: 0.5000 mL | Freq: Once | INTRAMUSCULAR | 1 refills | Status: AC

## 2019-10-16 NOTE — Progress Notes (Signed)
   Subjective:    Patient ID: Rebekah Andrews, female    DOB: February 21, 1966, 54 y.o.   MRN: [id number]  HPI The patient comes in today for a wellness visit.  Safety reviewed dietary reviewed healthy eating reviewed lipids slightly elevated but not in a worrisome range her risk calculates out to 1.7% Covid vaccine recommended  A review of their health history was completed.  A review of medications was also completed.  Any needed refills; update meds  Eating habits: health conscious  Falls/  MVA accidents in past few months: none  Regular exercise: walks 30 mins daily  Specialist pt sees on regular basis: gyn  Preventative health issues were discussed.   Additional concerns: none   Review of Systems     Objective:   Physical Exam Lungs clear respiratory rate normal heart regular no murmurs extremities no edema neck normal eardrums normal       Assessment & Plan:  Adult wellness-complete.wellness physical was conducted today. Importance of diet and exercise were discussed in detail.  In addition to this a discussion regarding safety was also covered. We also reviewed over immunizations and gave recommendations regarding current immunization needed for age.  In addition to this additional areas were also touched on including: Preventative health exams needed:  Colonoscopy she does agree to do colonoscopy this fall  Patient was advised yearly wellness exam

## 2019-10-17 ENCOUNTER — Encounter: Payer: Self-pay | Admitting: Family Medicine

## 2019-10-21 ENCOUNTER — Encounter: Payer: Self-pay | Admitting: Internal Medicine

## 2019-10-26 IMAGING — MG DIGITAL SCREENING BILATERAL MAMMOGRAM WITH TOMO AND CAD
8 series · 9 of 24 positions shown · non-contrast
Comparison: Previous exam(s).

CLINICAL DATA: Screening.

EXAM:
DIGITAL SCREENING BILATERAL MAMMOGRAM WITH TOMO AND CAD

[L MLO synth-2D]
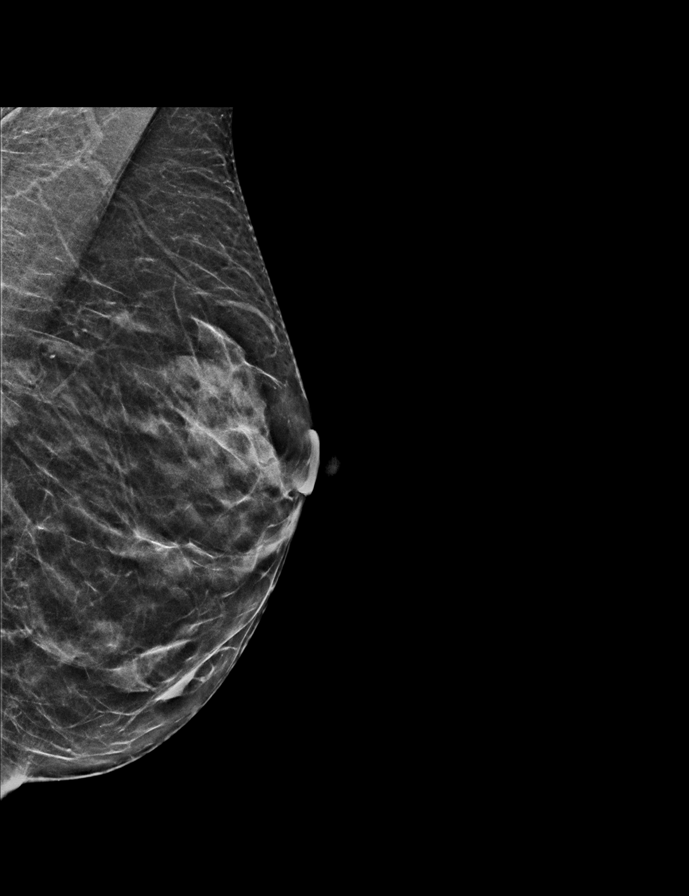

[L CC synth-2D]
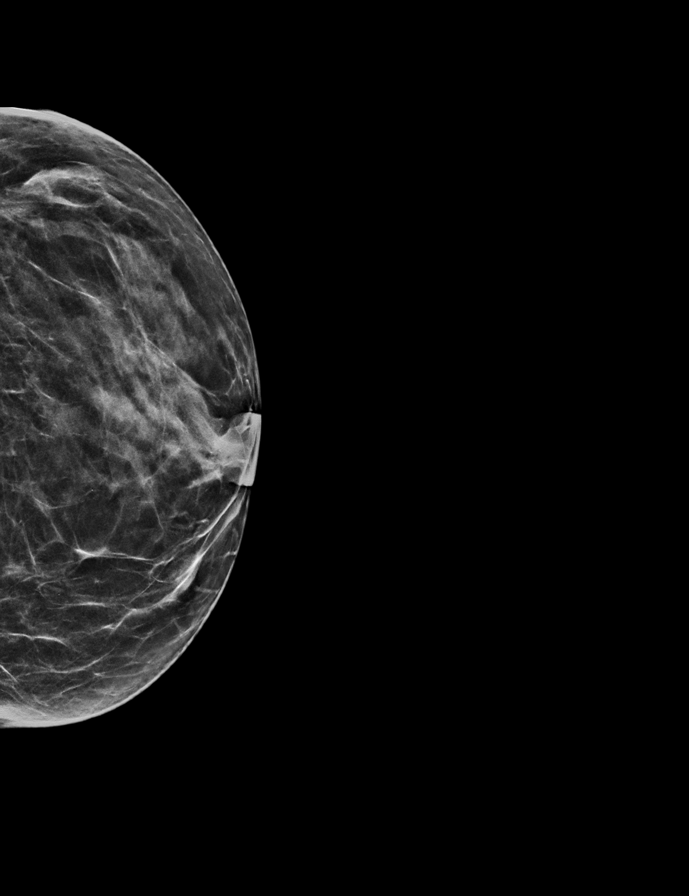

[R CC synth-2D]
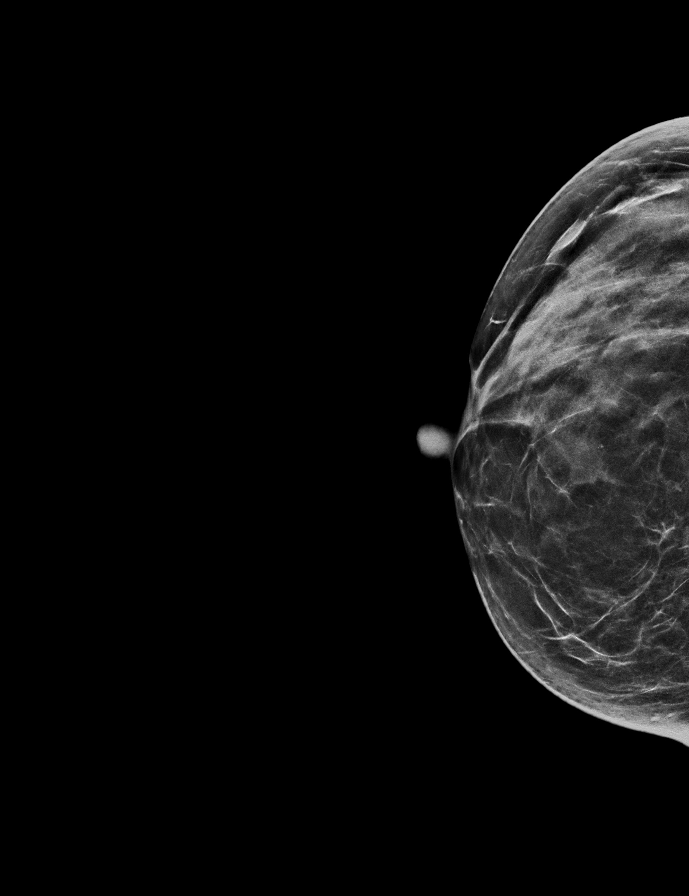

[R MLO synth-2D]
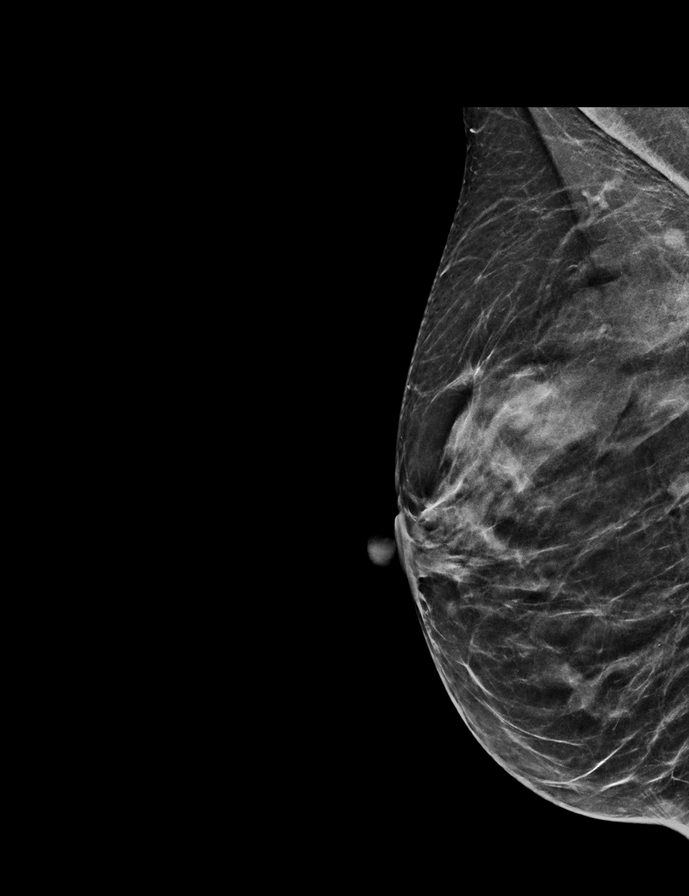

[L CC tomo · 2 of 48 frames shown]
[frame 16/48]
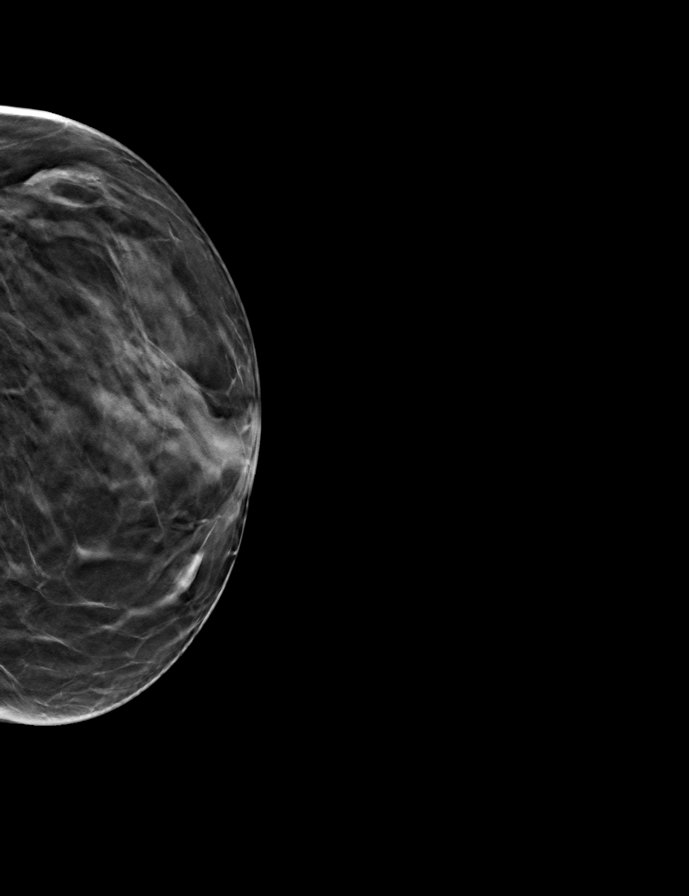
[frame 25/48]
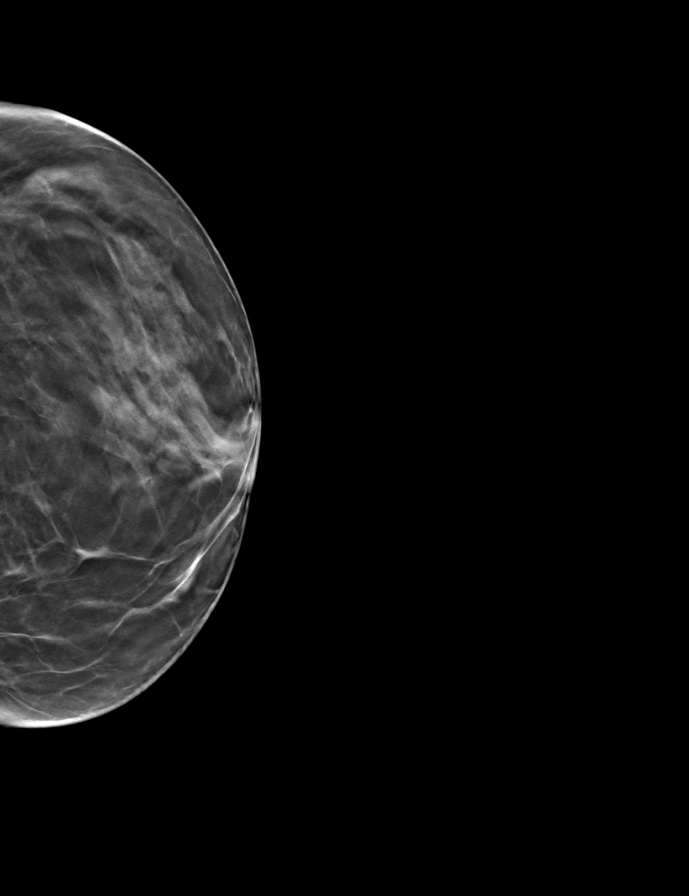

[L MLO tomo · tomo slice 24/47.0]
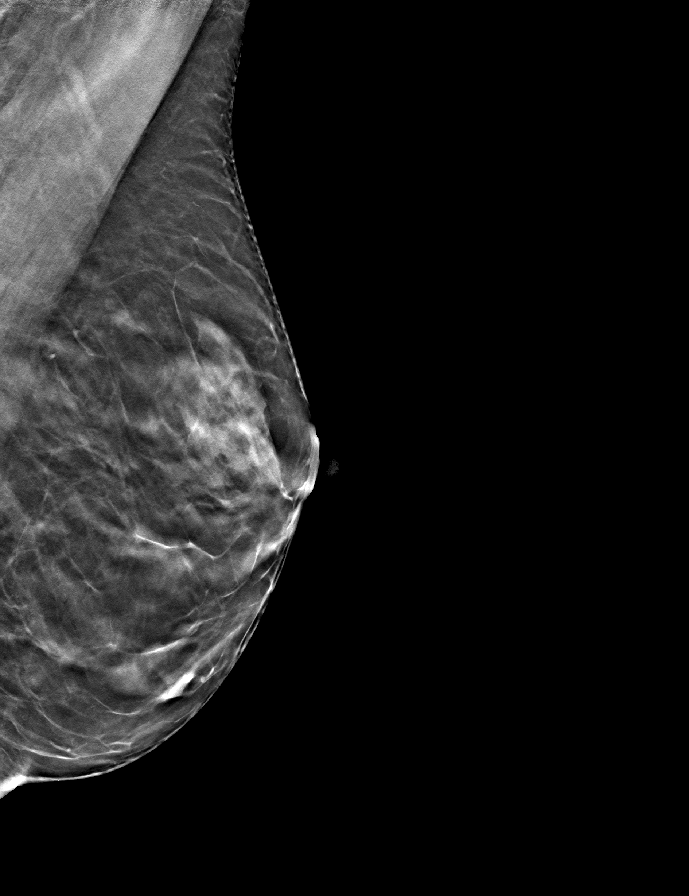

[R CC tomo · tomo slice 27/52.0]
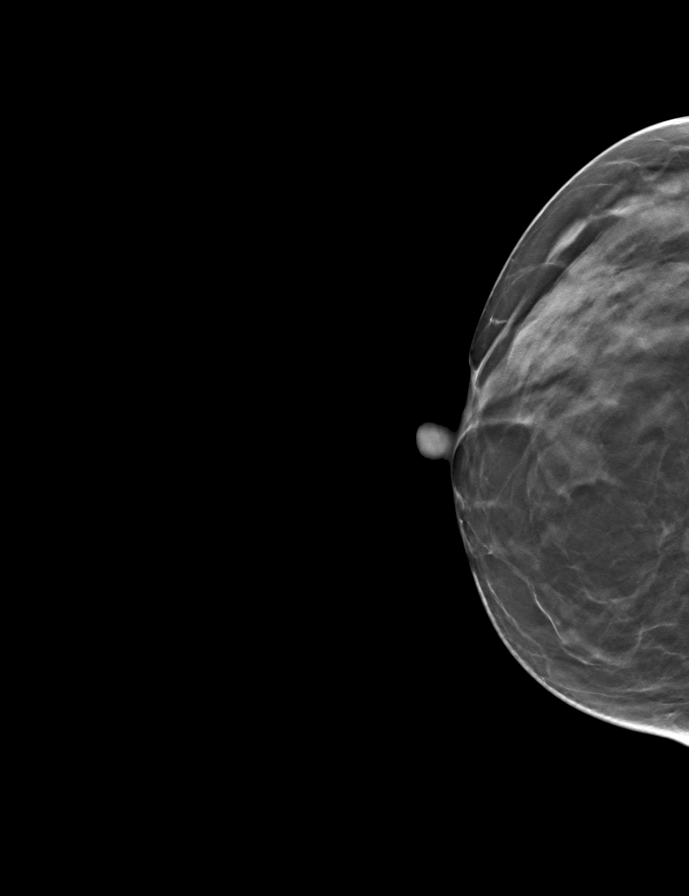

[R MLO tomo · tomo slice 25/49.0]
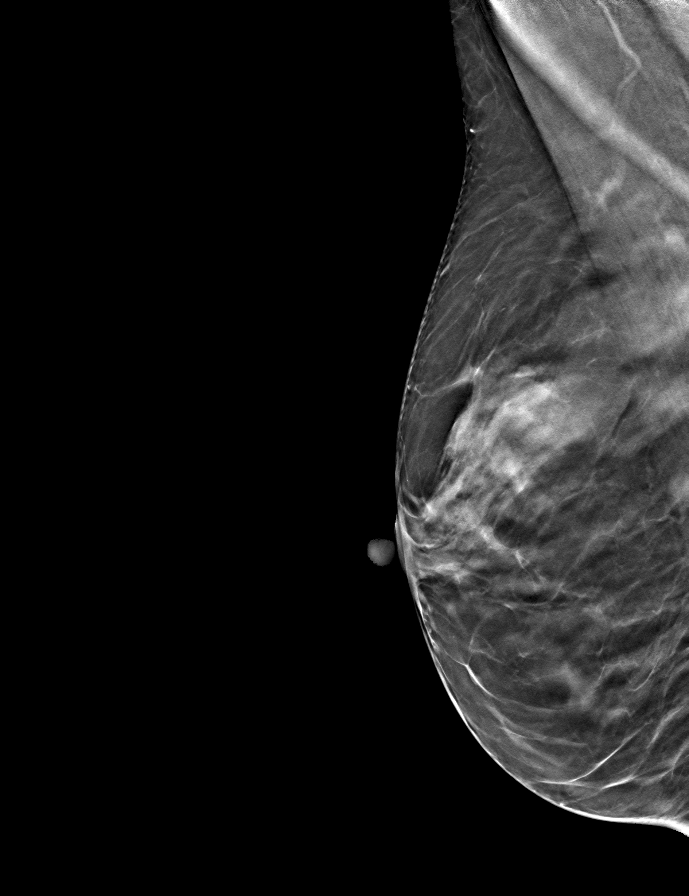

[9 of 24 positions shown; findings below may reference images not displayed]

ACR Breast Density Category b: There are scattered areas of
fibroglandular density.
FINDINGS: There are no findings suspicious for malignancy. Images were
processed with CAD.
IMPRESSION: No mammographic evidence of malignancy. A result letter of this
screening mammogram will be mailed directly to the patient.

RECOMMENDATION:
Screening mammogram in one year. (Code:[id number])

BI-RADS CATEGORY  1: Negative.

## 2019-12-17 ENCOUNTER — Other Ambulatory Visit: Payer: Self-pay | Admitting: Family Medicine

## 2019-12-18 ENCOUNTER — Ambulatory Visit: Payer: BC Managed Care – PPO | Admitting: Nurse Practitioner

## 2020-07-02 ENCOUNTER — Other Ambulatory Visit: Payer: Self-pay

## 2020-07-02 ENCOUNTER — Encounter: Payer: Self-pay | Admitting: Adult Health

## 2020-07-02 ENCOUNTER — Ambulatory Visit (INDEPENDENT_AMBULATORY_CARE_PROVIDER_SITE_OTHER): Payer: 59 | Admitting: Adult Health

## 2020-07-02 VITALS — BP 118/69 | HR 69 | Ht 66.0 in | Wt 137.2 lb

## 2020-07-02 DIAGNOSIS — R61 Generalized hyperhidrosis: Secondary | ICD-10-CM

## 2020-07-02 DIAGNOSIS — N941 Unspecified dyspareunia: Secondary | ICD-10-CM

## 2020-07-02 DIAGNOSIS — Z90710 Acquired absence of both cervix and uterus: Secondary | ICD-10-CM

## 2020-07-02 DIAGNOSIS — Z780 Asymptomatic menopausal state: Secondary | ICD-10-CM | POA: Diagnosis not present

## 2020-07-02 DIAGNOSIS — F419 Anxiety disorder, unspecified: Secondary | ICD-10-CM | POA: Diagnosis not present

## 2020-07-02 DIAGNOSIS — R6882 Decreased libido: Secondary | ICD-10-CM | POA: Insufficient documentation

## 2020-07-02 DIAGNOSIS — F32A Depression, unspecified: Secondary | ICD-10-CM

## 2020-07-02 DIAGNOSIS — N898 Other specified noninflammatory disorders of vagina: Secondary | ICD-10-CM | POA: Insufficient documentation

## 2020-07-02 MED ORDER — ESTRADIOL 1 MG PO TABS: 1.0000 mg | ORAL_TABLET | Freq: Every day | ORAL | 1 refills | Status: DC

## 2020-07-02 NOTE — Patient Instructions (Signed)
Menopause and Hormone Replacement Therapy Menopause is a normal time of life when menstrual periods stop completely and the ovaries stop producing the female hormones estrogen and progesterone. Low levels of these hormones can affect your health and cause symptoms. Hormone replacement therapy (HRT) can relieve some of those symptoms. HRT is the use of artificial (synthetic) hormones to replace hormones that your body has stopped producing because you have reached menopause. Types of HRT HRT may consist of the synthetic hormones estrogen and progestin, or it may consist of estrogen-only therapy. You and your health care provider will decide which form of HRT is best for you. If you choose to be on HRT and you have a uterus, estrogen and progestin are usually prescribed. Estrogen-only therapy is used for women who do not have a uterus. Possible options for taking HRT include:  Pills.  Patches.  Gels.  Sprays.  Vaginal cream.  Vaginal rings.  Vaginal inserts. The amount of hormones that you take and how long you take them varies according to your health. It is important to:  Begin HRT with the lowest possible dosage.  Stop HRT as soon as your health care provider tells you to stop.  Work with your health care provider so that you feel informed and comfortable with your decisions.   Tell a health care provider about:  Any allergies you have.  Whether you have had blood clots or know of any risk factors you may have for blood clots.  Whether you or family members have had cancer, especially cancer of the breasts, ovaries, or uterus.  Any surgeries you have had.  All medicines you are taking, including vitamins, herbs, eye drops, creams, and over-the-counter medicines.  Whether you are pregnant or may be pregnant.  Any medical conditions you have. What are the benefits? HRT can reduce the frequency and severity of menopausal symptoms. Benefits of HRT vary according to the kind  of symptoms that you have, how severe they are, and your overall health. HRT may help to improve the following symptoms of menopause:  Hot flashes and night sweats. These are sudden feelings of heat that spread over the face and body. The skin may turn red, like a blush. Night sweats are hot flashes that happen while you are sleeping or trying to sleep.  Bone loss (osteoporosis). The body loses calcium more quickly after menopause, causing the bones to become weaker. This can increase the risk for bone breaks (fractures).  Vaginal dryness. The lining of the vagina can become thin and dry, which can cause pain during sex or cause infection, burning, or itching.  Urinary tract infections.  Urinary incontinence. This is the inability to control when you urinate.  Irritability.  Short-term memory problems. What are the risks? Risks of HRT vary depending on your individual health and medical history. Risks of HRT also depend on whether you receive both estrogen and progestin or you receive estrogen only. HRT may increase the risk of:  Spotting. This is when a small amount of blood leaks from the vagina unexpectedly.  Endometrial cancer. This cancer is in the lining of the uterus (endometrium).  Breast cancer.  Increased density of breast tissue. This can make it harder to find breast cancer on a breast X-ray (mammogram).  Stroke.  Heart disease.  Blood clots.  Gallbladder disease or liver disease. Risks of HRT can increase if you have any of the following conditions:  Endometrial cancer.  Liver disease.  Heart disease.  Breast cancer.    History of blood clots.  History of stroke. Follow these instructions at home: Pap tests  Have Pap tests done as often as told by your health care provider. A Pap test is sometimes called a Pap smear. It is a screening test that is used to check for signs of cancer of the cervix and vagina. A Pap test can also identify the presence of  infection or precancerous changes. Pap tests may be done: ? Every 3 years, starting at age 21. ? Every 5 years, starting after age 30, in combination with testing for human papillomavirus (HPV). ? More often or less often depending on other medical conditions you have, your age, and other risk factors.  It is up to you to get the results of your Pap test. Ask your health care provider, or the department that is doing the test, when your results will be ready. General instructions  Take over-the-counter and prescription medicines only as told by your health care provider.  Do not use any products that contain nicotine or tobacco. These products include cigarettes, chewing tobacco, and vaping devices, such as e-cigarettes. If you need help quitting, ask your health care provider.  Get mammograms, pelvic exams, and medical checkups as often as told by your health care provider.  Keep all follow-up visits. This is important. Contact a health care provider if you have:  Pain or swelling in your legs.  Lumps or changes in your breasts or armpits.  Pain, burning, or bleeding when you urinate.  Unusual vaginal bleeding.  Dizziness or headaches.  Pain in your abdomen. Get help right away if you have:  Shortness of breath.  Chest pain.  Slurred speech.  Weakness or numbness in any part of your arms or legs. These symptoms may represent a serious problem that is an emergency. Do not wait to see if the symptoms will go away. Get medical help right away. Call your local emergency services (911 in the [place] Do not drive yourself to the hospital. Summary  Menopause is a normal time of life when menstrual periods stop completely and the ovaries stop producing the female hormones estrogen and progesterone.  HRT can reduce the frequency and severity of menopausal symptoms.  Risks of HRT vary depending on your individual health and medical history. This information is not intended to  replace advice given to you by your health care provider. Make sure you discuss any questions you have with your health care provider. Document Revised: 09/30/2019 Document Reviewed: 09/30/2019 Elsevier Patient Education  2021 Elsevier Inc. https://www.womenshealth.gov/menopause/menopause-basics"> https://www.clinicalkey.com">  Menopause Menopause is the normal time of a woman's life when menstrual periods stop completely. It marks the natural end to a woman's ability to become pregnant. It can be defined as the absence of a menstrual period for 12 months without another medical cause. The transition to menopause (perimenopause) most often happens between the ages of 45 and 55, and can last for many years. During perimenopause, hormone levels change in your body, which can cause symptoms and affect your health. Menopause may increase your risk for:  Weakened bones (osteoporosis), which causes fractures.  Depression.  Hardening and narrowing of the arteries (atherosclerosis), which can cause heart attacks and strokes. What are the causes? This condition is usually caused by a natural change in hormone levels that happens as you get older. The condition may also be caused by changes that are not natural, including:  Surgery to remove both ovaries (surgical menopause).  Side effects from some medicines, such   as chemotherapy used to treat cancer (chemical menopause). What increases the risk? This condition is more likely to start at an earlier age if you have certain medical conditions or have undergone treatments, including:  A tumor of the pituitary gland in the brain.  A disease that affects the ovaries and hormones.  Certain cancer treatments, such as chemotherapy or hormone therapy, or radiation therapy on the pelvis.  Heavy smoking and excessive alcohol use.  Family history of early menopause. This condition is also more likely to develop earlier in women who are very thin. What are  the signs or symptoms? Symptoms of this condition include:  Hot flashes.  Irregular menstrual periods.  Night sweats.  Changes in feelings about sex. This could be a decrease in sex drive or an increased discomfort around your sexuality.  Vaginal dryness and thinning of the vaginal walls. This may cause painful sex.  Dryness of the skin and development of wrinkles.  Headaches.  Problems sleeping (insomnia).  Mood swings or irritability.  Memory problems.  Weight gain.  Hair growth on the face and chest.  Bladder infections or problems with urinating. How is this diagnosed? This condition is diagnosed based on your medical history, a physical exam, your age, your menstrual history, and your symptoms. Hormone tests may also be done. How is this treated? In some cases, no treatment is needed. You and your health care provider should make a decision together about whether treatment is necessary. Treatment will be based on your individual condition and preferences. Treatment for this condition focuses on managing symptoms. Treatment may include:  Menopausal hormone therapy (MHT).  Medicines to treat specific symptoms or complications.  Acupuncture.  Vitamin or herbal supplements. Before starting treatment, make sure to let your health care provider know if you have a personal or family history of these conditions:  Heart disease.  Breast cancer.  Blood clots.  Diabetes.  Osteoporosis. Follow these instructions at home: Lifestyle  Do not use any products that contain nicotine or tobacco, such as cigarettes, e-cigarettes, and chewing tobacco. If you need help quitting, ask your health care provider.  Get at least 30 minutes of physical activity on 5 or more days each week.  Avoid alcoholic and caffeinated beverages, as well as spicy foods. This may help prevent hot flashes.  Get 7-8 hours of sleep each night.  If you have hot flashes, try: ? Dressing in  layers. ? Avoiding things that may trigger hot flashes, such as spicy food, warm places, or stress. ? Taking slow, deep breaths when a hot flash starts. ? Keeping a fan in your home and office.  Find ways to manage stress, such as deep breathing, meditation, or journaling.  Consider going to group therapy with other women who are having menopause symptoms. Ask your health care provider about recommended group therapy meetings. Eating and drinking  Eat a healthy, balanced diet that contains whole grains, lean protein, low-fat dairy, and plenty of fruits and vegetables.  Your health care provider may recommend adding more soy to your diet. Foods that contain soy include tofu, tempeh, and soy milk.  Eat plenty of foods that contain calcium and vitamin D for bone health. Items that are rich in calcium include low-fat milk, yogurt, beans, almonds, sardines, broccoli, and kale.   Medicines  Take over-the-counter and prescription medicines only as told by your health care provider.  Talk with your health care provider before starting any herbal supplements. If prescribed, take vitamins and supplements as   told by your health care provider. General instructions  Keep track of your menstrual periods, including: ? When they occur. ? How heavy they are and how long they last. ? How much time passes between periods.  Keep track of your symptoms, noting when they start, how often you have them, and how long they last.  Use vaginal lubricants or moisturizers to help with vaginal dryness and improve comfort during sex.  Keep all follow-up visits. This is important. This includes any group therapy or counseling.   Contact a health care provider if:  You are still having menstrual periods after age 55.  You have pain during sex.  You have not had a period for 12 months and you develop vaginal bleeding. Get help right away if you have:  Severe depression.  Excessive vaginal bleeding.  Pain  when you urinate.  A fast or irregular heartbeat (palpitations).  Severe headaches.  Abdominal pain or severe indigestion. Summary  Menopause is a normal time of life when menstrual periods stop completely. It is usually defined as the absence of a menstrual period for 12 months without another medical cause.  The transition to menopause (perimenopause) most often happens between the ages of 45 and 55 and can last for several years.  Symptoms can be managed through medicines, lifestyle changes, and complementary therapies such as acupuncture.  Eat a balanced diet that is rich in nutrients to promote bone health and heart health and to manage symptoms during menopause. This information is not intended to replace advice given to you by your health care provider. Make sure you discuss any questions you have with your health care provider. Document Revised: 12/27/2019 Document Reviewed: 09/12/2019 Elsevier Patient Education  2021 Elsevier Inc.  

## 2020-07-02 NOTE — Progress Notes (Addendum)
  Subjective:     Patient ID: Rebekah Andrews, female   DOB: 11/17/1965, 55 y.o.   MRN: [id number]  HPI Rebekah Andrews is a 55 year old white female,marriedm sp hysterectomy in 2016 for abnormal paps, and had abnormal vaginal pap in [place] in 2017, with negative colpo. She is complaining of night sweats on occasion and sleep disturbance and moody and vaginal dryness and pain with sex at times and feeling foggy and decreased libido. Has twinge in RLQ at times and some today but painted last night. Her sister died in 2021/10/29and she is her mom's care giver, so she has stress in her life she says.  PCP is Lilyan Punt MD  Review of Systems  Denies any problems with bowel movements or urination See HPI for positives. Reviewed past medical,surgical, social and family history. Reviewed medications and allergies.     Objective:   Physical Exam BP 118/69 (BP Location: Right Arm, Patient Position: Sitting, Cuff Size: Normal)   Pulse 69   Ht 5\' 6"  (1.676 m)   Wt 137 lb 3.2 oz (62.2 kg)   LMP 08/26/2013   BMI 22.14 kg/m   Skin warm and dry. Neck: mid line trachea, normal thyroid, good ROM, no lymphadenopathy noted. Lungs: clear to ausculation bilaterally. Cardiovascular: regular rate and rhythm. I reviewed her notes with 08/28/2013 from 2017 with her.  AA is 1 Fall risk is low PHQ 9 score is 8 GAD 7 score is 14  [facility] - 07/02/20 1343      Pregnancy Intention Screening   Does the patient want to become pregnant in the next year? No    Does the patient's partner want to become pregnant in the next year? No    Would the patient like to discuss contraceptive options today? No      Contraception Wrap Up   Current Method Female Sterilization   hyst   End Method Female Sterilization   hyst   Contraception Counseling Provided No             Assessment:     1. Menopause Discussed ET risks and benefits and wants to try estrogen Will rx estrace 1 mg Meds ordered this encounter   Medications  . estradiol (ESTRACE) 1 MG tablet    Sig: Take 1 tablet (1 mg total) by mouth daily.    Dispense:  30 tablet    Refill:  1    Order Specific Question:   Supervising Provider    Answer:   07/04/20 H [[id number]]  Review handouts on menopause and menopause and HRT  2. Anxiety and depression Declines SSRI, but may add Buspar at follow up  3. Night sweats  4. Decreased libido  5. Vaginal dryness  6. Dyspareunia, female Use good lubricate, like astro glide or Olive oil  7. S/P hysterectomy Discussed with her that paps do not detect vaginal cancer, will do pelvic exam at follow up.  Did discuss with Dr Duane Lope, and he agrees.     Plan:     Return in 4 weeks for ROS and pelvic exam

## 2020-07-24 ENCOUNTER — Other Ambulatory Visit: Payer: Self-pay | Admitting: Adult Health

## 2020-07-30 ENCOUNTER — Ambulatory Visit (INDEPENDENT_AMBULATORY_CARE_PROVIDER_SITE_OTHER): Payer: 59 | Admitting: Adult Health

## 2020-07-30 ENCOUNTER — Other Ambulatory Visit: Payer: Self-pay

## 2020-07-30 ENCOUNTER — Encounter: Payer: Self-pay | Admitting: Adult Health

## 2020-07-30 ENCOUNTER — Telehealth: Payer: Self-pay | Admitting: Family Medicine

## 2020-07-30 VITALS — BP 94/54 | HR 57 | Ht 66.0 in | Wt 135.8 lb

## 2020-07-30 DIAGNOSIS — Z1211 Encounter for screening for malignant neoplasm of colon: Secondary | ICD-10-CM | POA: Diagnosis not present

## 2020-07-30 DIAGNOSIS — Z0000 Encounter for general adult medical examination without abnormal findings: Secondary | ICD-10-CM

## 2020-07-30 DIAGNOSIS — F419 Anxiety disorder, unspecified: Secondary | ICD-10-CM

## 2020-07-30 DIAGNOSIS — Z114 Encounter for screening for human immunodeficiency virus [HIV]: Secondary | ICD-10-CM

## 2020-07-30 DIAGNOSIS — Z01419 Encounter for gynecological examination (general) (routine) without abnormal findings: Secondary | ICD-10-CM | POA: Diagnosis not present

## 2020-07-30 DIAGNOSIS — Z90710 Acquired absence of both cervix and uterus: Secondary | ICD-10-CM | POA: Diagnosis not present

## 2020-07-30 DIAGNOSIS — Z79899 Other long term (current) drug therapy: Secondary | ICD-10-CM

## 2020-07-30 DIAGNOSIS — Z780 Asymptomatic menopausal state: Secondary | ICD-10-CM

## 2020-07-30 DIAGNOSIS — E7849 Other hyperlipidemia: Secondary | ICD-10-CM

## 2020-07-30 DIAGNOSIS — Z1159 Encounter for screening for other viral diseases: Secondary | ICD-10-CM

## 2020-07-30 DIAGNOSIS — F32A Depression, unspecified: Secondary | ICD-10-CM

## 2020-07-30 LAB — HEMOCCULT GUIAC POC 1CARD (OFFICE): Fecal Occult Blood, POC: NEGATIVE

## 2020-07-30 MED ORDER — VENLAFAXINE HCL ER 37.5 MG PO CP24
37.5000 mg | ORAL_CAPSULE | Freq: Every day | ORAL | 3 refills | Status: DC
Start: 2020-07-30 — End: 2020-08-21

## 2020-07-30 NOTE — Telephone Encounter (Signed)
Last labs completed 10/08/19 CBC, HEP, LIPID, BMET. Please advise. Thank you

## 2020-07-30 NOTE — Progress Notes (Signed)
  Subjective:     Patient ID: [name], female   DOB: 09-01-1965, 55 y.o.   MRN: [id number]  HPI Rebekah Andrews is a 55 year old white female, married, sp hysterectomy in for pelvic exam and ROS after starting estrace 1 mg. She says not as dry, night sweats a little better, as are moods, still not sleeping well. She is stressed caring for mom and has been taking step brother who is 68 to chemo and radiation, he lives in [place] area. PCP is Lilyan Punt MD.  Review of Systems Still not sleeping great +fatigue Reviewed past medical,surgical, social and family history. Reviewed medications and allergies.     Objective:   Physical Exam BP (!) 94/54 (BP Location: Right Arm, Patient Position: Sitting, Cuff Size: Normal)   Pulse (!) 57   Ht 5\' 6"  (1.676 m)   Wt 135 lb 12.8 oz (61.6 kg)   LMP 08/26/2013   BMI 21.92 kg/m  Skin warm and dry.Pelvic: external genitalia is normal in appearance no lesions, vagina: pale, fair moisture, loss of rugae and no lesions, noted,urethra has no lesions or masses noted, cervix and uterus are absent, adnexa: no masses or tenderness noted. Bladder is non tender and no masses felt. On rectal has good tone, mo masses, and hemoccult negative    AA is 1 Fall risk is low PHQ 9 score is 10, is open to meds GAD 7 score is 14  [facility] - 07/30/20 0922      Pregnancy Intention Screening   Does the patient want to become pregnant in the next year? No    Does the patient's partner want to become pregnant in the next year? No    Would the patient like to discuss contraceptive options today? No      Contraception Wrap Up   Current Method Female Sterilization   hyst   End Method Female Sterilization   hyst   Contraception Counseling Provided No         Examination chaperoned by 08/01/20  Assessment:     1. Normal pelvic exam Pelvic exam in 1 year  2. Encounter for screening fecal occult blood testing   3. Menopause Continue Estrace 1 mg po daily, has  refills  4. S/P hysterectomy   5. Anxiety and depression Will rx effexor Meds ordered this encounter  Medications  . venlafaxine XR (EFFEXOR XR) 37.5 MG 24 hr capsule    Sig: Take 1 capsule (37.5 mg total) by mouth daily with breakfast.    Dispense:  30 capsule    Refill:  3    Order Specific Question:   Supervising Provider    Answer:   Psychologist, [facility] probation services [[id number]]      Plan:     Follow up in 3 months, can call sooner if needed

## 2020-07-30 NOTE — Telephone Encounter (Signed)
Patient has 6 month follow up 7/13 and would like labs done

## 2020-07-30 NOTE — Telephone Encounter (Signed)
Left message to return call 

## 2020-07-30 NOTE — Telephone Encounter (Signed)
Please talk with patient I reviewed over her lab work from last year. This year I would recommend CMP and lipid-these testing will look at cholesterol kidney function liver function. Also to the CDC does recommend that all patients get a one-time test for hepatitis C antibody and HIV antibody therefore go ahead and order these as well  If she would like any additional test please alert me before ordering otherwise these would be the test I would recommend thank you

## 2020-07-31 NOTE — Telephone Encounter (Signed)
Pt returned call and verbalized understanding. Pt agrees to all lab orders. Orders placed and pt verbalized understanding.

## 2020-08-21 ENCOUNTER — Other Ambulatory Visit: Payer: Self-pay | Admitting: Adult Health

## 2020-09-14 ENCOUNTER — Other Ambulatory Visit: Payer: Self-pay | Admitting: Family Medicine

## 2020-10-07 ENCOUNTER — Other Ambulatory Visit (HOSPITAL_COMMUNITY): Payer: Self-pay | Admitting: Family Medicine

## 2020-10-07 DIAGNOSIS — Z1231 Encounter for screening mammogram for malignant neoplasm of breast: Secondary | ICD-10-CM

## 2020-10-16 ENCOUNTER — Ambulatory Visit: Payer: 59 | Admitting: Family Medicine

## 2020-10-19 ENCOUNTER — Other Ambulatory Visit: Payer: Self-pay

## 2020-10-19 ENCOUNTER — Ambulatory Visit (HOSPITAL_COMMUNITY)
Admission: RE | Admit: 2020-10-19 | Discharge: 2020-10-19 | Disposition: A | Discharge status: Home / Self Care | Payer: 59 | Source: Ambulatory Visit | Attending: Family Medicine | Admitting: Family Medicine

## 2020-10-19 DIAGNOSIS — Z1231 Encounter for screening mammogram for malignant neoplasm of breast: Secondary | ICD-10-CM | POA: Insufficient documentation

## 2020-10-21 ENCOUNTER — Ambulatory Visit: Payer: 59 | Admitting: Family Medicine

## 2020-10-29 ENCOUNTER — Ambulatory Visit: Payer: 59 | Admitting: Adult Health

## 2020-11-17 LAB — LIPID PANEL
Chol/HDL Ratio: 2.7 ratio (ref 0.0–4.4)
Cholesterol, Total: 207 mg/dL — ABNORMAL HIGH (ref 100–199)
HDL: 76 mg/dL (ref >39)
LDL Chol Calc (NIH): 112 mg/dL — ABNORMAL HIGH (ref 0–99)
Triglycerides: 107 mg/dL (ref 0–149)
VLDL Cholesterol Cal: 19 mg/dL (ref 5–40)

## 2020-11-17 LAB — COMPREHENSIVE METABOLIC PANEL
ALT: 6 IU/L (ref 0–32)
AST: 14 IU/L (ref 0–40)
Albumin/Globulin Ratio: 1.5 (ref 1.2–2.2)
Albumin: 4.1 g/dL (ref 3.8–4.9)
Alkaline Phosphatase: 49 IU/L (ref 44–121)
BUN/Creatinine Ratio: 12 (ref 9–23)
BUN: 9 mg/dL (ref 6–24)
Bilirubin Total: 0.6 mg/dL (ref 0.0–1.2)
CO2: 25 mmol/L (ref 20–29)
Calcium: 8.9 mg/dL (ref 8.7–10.2)
Chloride: 103 mmol/L (ref 96–106)
Creatinine, Ser: 0.75 mg/dL (ref 0.57–1.00)
Globulin, Total: 2.7 g/dL (ref 1.5–4.5)
Glucose: 83 mg/dL (ref 65–99)
Potassium: 4.1 mmol/L (ref 3.5–5.2)
Sodium: 139 mmol/L (ref 134–144)
Total Protein: 6.8 g/dL (ref 6.0–8.5)
eGFR: 93 mL/min/{1.73_m2} (ref >59)

## 2020-11-17 LAB — HEPATITIS C ANTIBODY: Hep C Virus Ab: <0.1 s/co ratio (ref 0.0–0.9)

## 2020-11-17 LAB — HIV ANTIBODY (ROUTINE TESTING W REFLEX): HIV Screen 4th Generation wRfx: NONREACTIVE

## 2020-11-20 ENCOUNTER — Ambulatory Visit: Payer: 59 | Admitting: Family Medicine

## 2020-12-08 ENCOUNTER — Other Ambulatory Visit: Payer: Self-pay

## 2020-12-08 ENCOUNTER — Telehealth: Payer: Self-pay | Admitting: Family Medicine

## 2020-12-08 ENCOUNTER — Ambulatory Visit (INDEPENDENT_AMBULATORY_CARE_PROVIDER_SITE_OTHER): Payer: 59 | Admitting: Family Medicine

## 2020-12-08 VITALS — Temp 100.6°F | Wt 133.6 lb

## 2020-12-08 DIAGNOSIS — B349 Viral infection, unspecified: Secondary | ICD-10-CM | POA: Diagnosis not present

## 2020-12-08 MED ORDER — ALPRAZOLAM 1 MG PO TABS: ORAL_TABLET | ORAL | 1 refills | Status: DC

## 2020-12-08 NOTE — Telephone Encounter (Signed)
Ms. iana, buzan are scheduled for a virtual visit with your provider today.    Just as we do with appointments in the office, we must obtain your consent to participate.  Your consent will be active for this visit and any virtual visit you may have with one of our providers in the next 365 days.    If you have a MyChart account, I can also send a copy of this consent to you electronically.  All virtual visits are billed to your insurance company just like a traditional visit in the office.  As this is a virtual visit, video technology does not allow for your provider to perform a traditional examination.  This may limit your provider's ability to fully assess your condition.  If your provider identifies any concerns that need to be evaluated in person or the need to arrange testing such as labs, EKG, etc, we will make arrangements to do so.    Although advances in technology are sophisticated, we cannot ensure that it will always work on either your end or our end.  If the connection with a video visit is poor, we may have to switch to a telephone visit.  With either a video or telephone visit, we are not always able to ensure that we have a secure connection.   I need to obtain your verbal consent now.   Are you willing to proceed with your visit today?   Rebekah Andrews has provided verbal consent on 12/08/2020 for a virtual visit (video or telephone).   Marlowe Shores, LPN 09/06/4130  4:40 AM

## 2020-12-08 NOTE — Progress Notes (Signed)
   Subjective:    Patient ID: Rebekah Andrews, female    DOB: 1965/05/30, 55 y.o.   MRN: [id number] Initially this was a virtual visit but because patient was having significant symptoms she was brought in to be seen so therefore there was a in person visit HPI Pt began having 101.8 temp yesterday afternoon, chills, headache, head and chest congestion. Temp is now down to 99.8. Pt has not taken a COVID test at this time. Has been utilizing honey Robitussin and Tylenol. No trouble breathing or shortness of breath.   Patient has chronic insomnia uses Xanax at nighttime states it does do a good job at helping her sleep she has been on this for years we will go ahead with refills Virtual Visit via Telephone Note  I connected with Rebekah Andrews on 12/08/20 at  8:40 AM EDT by telephone and verified that I am speaking with the correct person using two identifiers.  Location: Patient: home Provider: office   I discussed the limitations, risks, security and privacy concerns of performing an evaluation and management service by telephone and the availability of in person appointments. I also discussed with the patient that there may be a patient responsible charge related to this service. The patient expressed understanding and agreed to proceed.   History of Present Illness:    Observations/Objective:   Assessment and Plan:   Follow Up Instructions:    I discussed the assessment and treatment plan with the patient. The patient was provided an opportunity to ask questions and all were answered. The patient agreed with the plan and demonstrated an understanding of the instructions.   The patient was advised to call back or seek an in-person evaluation if the symptoms worsen or if the condition fails to improve as anticipated.      Review of Systems     Objective:   Physical Exam Eardrums normal lungs clear heart regular no respiratory distress O2 saturation 98%       Assessment  & Plan:  Viral illness Possible COVID Will send COVID test Supportive measures discussed If progressive issues or worrisome issues follow-up immediately Stay self isolated from others for the next few days until results are back  Insomnia needs refills of her Xanax this was given  Patient does female health checkups I encourage her to get a colonoscopy she would like to think about it currently  COVID test was sent Warning signs was discussed Follow-up if ongoing trouble

## 2020-12-09 LAB — NOVEL CORONAVIRUS, NAA: SARS-CoV-2, NAA: DETECTED — AB

## 2020-12-09 LAB — SARS-COV-2, NAA 2 DAY TAT

## 2021-01-20 ENCOUNTER — Other Ambulatory Visit: Payer: Self-pay | Admitting: Adult Health

## 2021-01-21 ENCOUNTER — Other Ambulatory Visit: Payer: Self-pay | Admitting: Family Medicine

## 2021-01-22 ENCOUNTER — Other Ambulatory Visit: Payer: Self-pay | Admitting: Adult Health

## 2021-07-19 ENCOUNTER — Other Ambulatory Visit: Payer: Self-pay | Admitting: Adult Health

## 2021-08-07 ENCOUNTER — Other Ambulatory Visit: Payer: Self-pay | Admitting: Family Medicine

## 2021-08-23 ENCOUNTER — Ambulatory Visit (INDEPENDENT_AMBULATORY_CARE_PROVIDER_SITE_OTHER): Payer: 59 | Admitting: Family Medicine

## 2021-08-23 ENCOUNTER — Encounter: Payer: Self-pay | Admitting: Family Medicine

## 2021-08-23 VITALS — BP 124/76 | HR 78 | Temp 97.5°F | Wt 131.6 lb

## 2021-08-23 DIAGNOSIS — J019 Acute sinusitis, unspecified: Secondary | ICD-10-CM | POA: Diagnosis not present

## 2021-08-23 MED ORDER — CEFDINIR 300 MG PO CAPS: 300.0000 mg | ORAL_CAPSULE | Freq: Two times a day (BID) | ORAL | 0 refills | Status: DC

## 2021-08-23 NOTE — Progress Notes (Signed)
? ?  Subjective:  ? ? Patient ID: Rebekah Andrews, female    DOB: April 08, 1966, 56 y.o.   MRN: [id number] ? ?HPI ?Pt having chest and head congestion, slight headache, sore throat last week which has improved, and cough. Pt has been taking Tylenol and Robitussin.  ?Significant head congestion drainage coughing no wheezing ? ?Review of Systems ? ?   ?Objective:  ? Physical Exam ?Gen-NAD not toxic ?TMS-normal bilateral ?T- normal no redness ?Chest-CTA respiratory rate normal no crackles ?CV RRR no murmur ?Skin-warm dry ?Neuro-grossly normal ? ? ? ?   ?Assessment & Plan:  ?Acute rhinosinusitis antibiotic prescribed warning signs discussed follow-up ongoing troubles ? ?

## 2021-09-07 ENCOUNTER — Other Ambulatory Visit: Payer: Self-pay | Admitting: Family Medicine

## 2021-09-09 ENCOUNTER — Other Ambulatory Visit: Payer: Self-pay | Admitting: *Deleted

## 2021-09-09 ENCOUNTER — Telehealth: Payer: Self-pay | Admitting: Family Medicine

## 2021-09-09 MED ORDER — CEFDINIR 300 MG PO CAPS: 300.0000 mg | ORAL_CAPSULE | Freq: Two times a day (BID) | ORAL | 0 refills | Status: DC

## 2021-09-09 MED ORDER — ALPRAZOLAM 1 MG PO TABS: ORAL_TABLET | ORAL | 0 refills | Status: DC

## 2021-09-09 NOTE — Telephone Encounter (Signed)
Refill request pended to provider.

## 2021-09-09 NOTE — Telephone Encounter (Signed)
See other message 09/09/21

## 2021-09-09 NOTE — Telephone Encounter (Signed)
Patient was seen 5/15 with sinus infection ,she states finished up antibiotic but still has drainage and cough . She is requesting another round of antibiotics called into [facility].

## 2021-09-09 NOTE — Telephone Encounter (Signed)
[facility] [place] [facility] due to medication shortage and she needs a new script sent to [facility]

## 2021-09-09 NOTE — Telephone Encounter (Signed)
Patient states is flying out Sunday and would like to have renewal prescription of her antibiotics Due to nasal congestion with drainage, still coughing up clear mucous, no headaches, fever, or sob, scratchy throat. Would like antibiotics and alprazolam also [facility] [place] due to availability.

## 2021-09-09 NOTE — Telephone Encounter (Signed)
Patient notified

## 2021-09-30 ENCOUNTER — Other Ambulatory Visit (HOSPITAL_COMMUNITY): Payer: Self-pay | Admitting: Family Medicine

## 2021-09-30 DIAGNOSIS — Z1231 Encounter for screening mammogram for malignant neoplasm of breast: Secondary | ICD-10-CM

## 2021-10-13 ENCOUNTER — Other Ambulatory Visit: Payer: Self-pay | Admitting: Family Medicine

## 2021-10-21 ENCOUNTER — Telehealth: Payer: Self-pay | Admitting: Family Medicine

## 2021-10-21 NOTE — Telephone Encounter (Signed)
Last labs completed 11/16/20-HIV, HepC, Lipid and CMP. Please advise. Thank you!

## 2021-10-21 NOTE — Telephone Encounter (Signed)
Patient has 6 month follow up 8/31 and needing labs done

## 2021-10-21 NOTE — Telephone Encounter (Signed)
Lipid, metabolic 7 are the only test that I would recommend currently Diagnosis hyperlipidemia, medication monitoring

## 2021-10-22 ENCOUNTER — Ambulatory Visit (HOSPITAL_COMMUNITY)
Admission: RE | Admit: 2021-10-22 | Discharge: 2021-10-22 | Disposition: A | Discharge status: Home / Self Care | Payer: 59 | Source: Ambulatory Visit | Attending: Family Medicine | Admitting: Family Medicine

## 2021-10-22 ENCOUNTER — Other Ambulatory Visit: Payer: Self-pay

## 2021-10-22 DIAGNOSIS — E7849 Other hyperlipidemia: Secondary | ICD-10-CM

## 2021-10-22 DIAGNOSIS — Z1231 Encounter for screening mammogram for malignant neoplasm of breast: Secondary | ICD-10-CM | POA: Insufficient documentation

## 2021-10-22 DIAGNOSIS — Z79899 Other long term (current) drug therapy: Secondary | ICD-10-CM

## 2021-10-22 NOTE — Telephone Encounter (Addendum)
Patient made aware per drs lab orders.

## 2021-10-26 ENCOUNTER — Other Ambulatory Visit (HOSPITAL_COMMUNITY): Payer: Self-pay | Admitting: Family Medicine

## 2021-10-26 DIAGNOSIS — R928 Other abnormal and inconclusive findings on diagnostic imaging of breast: Secondary | ICD-10-CM

## 2021-11-12 ENCOUNTER — Ambulatory Visit (HOSPITAL_COMMUNITY)
Admission: RE | Admit: 2021-11-12 | Discharge: 2021-11-12 | Disposition: A | Discharge status: Home / Self Care | Payer: 59 | Source: Ambulatory Visit | Attending: Family Medicine | Admitting: Family Medicine

## 2021-11-12 DIAGNOSIS — R928 Other abnormal and inconclusive findings on diagnostic imaging of breast: Secondary | ICD-10-CM

## 2021-11-25 ENCOUNTER — Telehealth: Payer: Self-pay | Admitting: Family Medicine

## 2021-11-25 NOTE — Telephone Encounter (Signed)
Patient requested estrogen levels to be tested along with previously ordered lipid and bmp. Appt 12/09/21.

## 2021-11-25 NOTE — Telephone Encounter (Signed)
Nurses-I am not familiar with doing the estrogen level. If she is interested in seeing whether or not she is within menopause [facility]

## 2021-11-25 NOTE — Telephone Encounter (Signed)
Patient is requesting to have her estrogen levels tested along with regular labs has physical on 8/31

## 2021-11-25 NOTE — Telephone Encounter (Signed)
Left message for patient to return the call for additional details and recommendations.   

## 2021-11-26 NOTE — Telephone Encounter (Signed)
Patient has been informed per drs recommendations. She verbalizes understanding.

## 2021-12-04 LAB — BASIC METABOLIC PANEL
BUN/Creatinine Ratio: 15 (ref 9–23)
BUN: 11 mg/dL (ref 6–24)
CO2: 24 mmol/L (ref 20–29)
Calcium: 9.1 mg/dL (ref 8.7–10.2)
Chloride: 103 mmol/L (ref 96–106)
Creatinine, Ser: 0.71 mg/dL (ref 0.57–1.00)
Glucose: 89 mg/dL (ref 70–99)
Potassium: 4.6 mmol/L (ref 3.5–5.2)
Sodium: 142 mmol/L (ref 134–144)
eGFR: 98 mL/min/{1.73_m2} (ref >59)

## 2021-12-04 LAB — LIPID PANEL
Chol/HDL Ratio: 2.5 ratio (ref 0.0–4.4)
Cholesterol, Total: 207 mg/dL — ABNORMAL HIGH (ref 100–199)
HDL: 84 mg/dL (ref >39)
LDL Chol Calc (NIH): 109 mg/dL — ABNORMAL HIGH (ref 0–99)
Triglycerides: 81 mg/dL (ref 0–149)
VLDL Cholesterol Cal: 14 mg/dL (ref 5–40)

## 2021-12-09 ENCOUNTER — Encounter: Payer: Self-pay | Admitting: Family Medicine

## 2021-12-09 ENCOUNTER — Ambulatory Visit (INDEPENDENT_AMBULATORY_CARE_PROVIDER_SITE_OTHER): Payer: 59 | Admitting: Family Medicine

## 2021-12-09 ENCOUNTER — Other Ambulatory Visit: Payer: Self-pay

## 2021-12-09 VITALS — BP 105/66 | Wt 134.0 lb

## 2021-12-09 DIAGNOSIS — E7849 Other hyperlipidemia: Secondary | ICD-10-CM

## 2021-12-09 DIAGNOSIS — N951 Menopausal and female climacteric states: Secondary | ICD-10-CM | POA: Diagnosis not present

## 2021-12-09 DIAGNOSIS — Z1211 Encounter for screening for malignant neoplasm of colon: Secondary | ICD-10-CM

## 2021-12-09 DIAGNOSIS — R61 Generalized hyperhidrosis: Secondary | ICD-10-CM | POA: Diagnosis not present

## 2021-12-09 DIAGNOSIS — G4700 Insomnia, unspecified: Secondary | ICD-10-CM | POA: Diagnosis not present

## 2021-12-09 MED ORDER — ESTRADIOL 2 MG PO TABS: 2.0000 mg | ORAL_TABLET | Freq: Every day | ORAL | 1 refills | Status: DC

## 2021-12-09 MED ORDER — TRAZODONE HCL 50 MG PO TABS: 25.0000 mg | ORAL_TABLET | Freq: Every evening | ORAL | 3 refills | Status: DC | PRN

## 2021-12-09 NOTE — Patient Instructions (Signed)
We have sent the update regarding estrogen Also remember to taper down on the Effexor 37.5 mg when you start the trazodone.  Please send me an update within 3 to 4 weeks thanks-Dr. Lorin Picket

## 2021-12-09 NOTE — Progress Notes (Signed)
   Subjective:    Patient ID: Rebekah Andrews, female    DOB: 16-Nov-1965, 56 y.o.   MRN: [id number]  HPI Pt arrives for follow up. Pt recently had blood work completed. No issues at this time.   Pt has been having more "daytime hotness" and GYN stated we may need to up her Estrace. Falling asleep and staying asleep have worsened since menopause.  Patient finds her self having difficult time staying asleep is able to fall asleep sleeps a few hours and wakes up difficult time falling back to sleep she is taking care of her elderly mother she also does some taking care of her grandchildren but denies being depressed She was placed on Effexor but she really does not feel like that is doing much of anything for stress Review of Systems     Objective:   Physical Exam  General-in no acute distress Eyes-no discharge Lungs-respiratory rate normal, CTA CV-no murmurs,RRR Extremities skin warm dry no edema Neuro grossly normal Behavior normal, alert       Assessment & Plan:  1. Hot flash, menopausal Menopausal We did discuss risk and benefits of going up on estrogen Go ahead and go up on Estrace 2 mg daily after 6 months if things are going good look at potentially going back down  2. Insomnia, unspecified type Xanax at nighttime allows her to fall asleep but she does not stay asleep we did discuss trazodone she will try half a tablet in the evening that does not do enough she will advance this to a full tablet continue the Xanax if things are going well in several weeks I would recommend reducing the Xanax over time from 1 mg to 0.75 mg for several weeks then reduce to 0.5 mg for several weeks  3. Other hyperlipidemia Numbers are not severe  4. Night sweats Hopefully increase of Estrace will help  We did discuss tapering off of the Effexor while on the trazodone if she has withdrawal symptoms with that cut the capsule in half and do that for several weeks then stop  Lab work was  reviewed  Called guard recommended

## 2021-12-21 DIAGNOSIS — Z1211 Encounter for screening for malignant neoplasm of colon: Secondary | ICD-10-CM | POA: Diagnosis not present

## 2021-12-27 LAB — COLOGUARD: COLOGUARD: NEGATIVE

## 2021-12-28 ENCOUNTER — Encounter: Payer: Self-pay | Admitting: Family Medicine

## 2021-12-28 NOTE — Progress Notes (Signed)
Please mail to the patient she does not utilize her MyChart thank you

## 2021-12-31 ENCOUNTER — Other Ambulatory Visit: Payer: Self-pay | Admitting: Family Medicine

## 2022-01-19 ENCOUNTER — Telehealth: Payer: Self-pay

## 2022-01-19 ENCOUNTER — Other Ambulatory Visit: Payer: Self-pay | Admitting: Adult Health

## 2022-01-19 NOTE — Telephone Encounter (Signed)
Encourage patient to contact the pharmacy for refills or they can request refills through [facility]  (Please schedule appointment if patient has not been seen in over a year)    WHAT PHARMACY WOULD THEY LIKE THIS SENT TO: [id number] [place] [place] [address], [place] [address]   MEDICATION NAME & DOSE:estradiol ([place]) 2 MG tablet   NOTES/COMMENTS FROM PATIENT:      [place] office please notify patient: It takes 48-72 hours to process rx refill requests Ask patient to call pharmacy to ensure rx is ready before heading there.

## 2022-01-20 NOTE — Telephone Encounter (Signed)
Confirmed patient has a refill remaining this month and due to insurance change next refill will require 30 day supplies.

## 2022-02-08 ENCOUNTER — Ambulatory Visit: Payer: 59 | Admitting: Family Medicine

## 2022-02-08 VITALS — BP 129/64 | HR 50 | Temp 97.5°F | Ht 66.0 in | Wt 136.0 lb

## 2022-02-08 DIAGNOSIS — G4700 Insomnia, unspecified: Secondary | ICD-10-CM | POA: Diagnosis not present

## 2022-02-08 DIAGNOSIS — F439 Reaction to severe stress, unspecified: Secondary | ICD-10-CM

## 2022-02-08 DIAGNOSIS — R69 Illness, unspecified: Secondary | ICD-10-CM | POA: Diagnosis not present

## 2022-02-08 MED ORDER — FLUOXETINE HCL 10 MG PO CAPS: 10.0000 mg | ORAL_CAPSULE | Freq: Every day | ORAL | 3 refills | Status: DC

## 2022-02-08 NOTE — Patient Instructions (Signed)
Please send me a MyChart message in 3 weeks  We will see you back in approximately 6 weeks  Sooner if any problems  TakeCare-Dr. Nicki Reaper

## 2022-02-08 NOTE — Progress Notes (Signed)
   Subjective:    Patient ID: Rebekah Andrews, female    DOB: 11/19/1965, 56 y.o.   MRN: [id number]  HPI Very nice patient here today for further evaluation Under a lot of stress Her daughter with 2 young kids are living at the house while the daughter's house is being remodeled In addition to this at times she has to babysit the 2 young kids She also has a step brother who has lung cancer stage IV Also has a mother who has multiple health issues for which she is helping Mother lives in [place] Patient relates that she has times where she is not sleeping well Also times where she feels anxious stressed out and at times tearful   Review of Systems     Objective:   Physical Exam  No exam today this was a long discussion on multiple various issues      Assessment & Plan:  Significant stress Start Prozac 10 mg daily Give [place] feedback how things are going in the next 3 weeks Follow-up within 6 weeks Sleep is an issue but unfortunately sleep medications increased risk of dementia patient will manage behaviorally Avoid benzodiazepines for sleep Avoid antihistamines for sleep Patient did not tolerate trazodone

## 2022-02-24 ENCOUNTER — Other Ambulatory Visit: Payer: Self-pay | Admitting: Family Medicine

## 2022-03-23 ENCOUNTER — Ambulatory Visit: Payer: 59 | Admitting: Family Medicine

## 2022-05-20 ENCOUNTER — Other Ambulatory Visit: Payer: Self-pay | Admitting: Family Medicine

## 2022-06-01 ENCOUNTER — Other Ambulatory Visit: Payer: Self-pay | Admitting: Family Medicine

## 2022-06-10 ENCOUNTER — Ambulatory Visit: Payer: 59 | Admitting: Family Medicine

## 2022-06-19 ENCOUNTER — Other Ambulatory Visit: Payer: Self-pay | Admitting: Family Medicine

## 2022-08-22 ENCOUNTER — Other Ambulatory Visit: Payer: Self-pay | Admitting: Family Medicine

## 2022-09-18 ENCOUNTER — Other Ambulatory Visit: Payer: Self-pay | Admitting: Family Medicine

## 2022-11-01 ENCOUNTER — Telehealth: Payer: Self-pay

## 2022-11-01 DIAGNOSIS — E7849 Other hyperlipidemia: Secondary | ICD-10-CM

## 2022-11-01 DIAGNOSIS — N951 Menopausal and female climacteric states: Secondary | ICD-10-CM

## 2022-11-01 NOTE — Telephone Encounter (Signed)
Pt schedule a follow up and she wants blood work ordered as well as to test her estrogen levels   Pt call back Loree Fee [phone]

## 2022-11-02 ENCOUNTER — Encounter: Payer: Self-pay | Admitting: Family Medicine

## 2022-11-03 NOTE — Telephone Encounter (Signed)
Nurses-I would recommend lipid, CMP, CBC Screening lipid, hot flashes  We typically do not check estrogen or hormone levels-please let the patient know that I did connect with gynecologist to see if they recommend this type of testing.  They state that they do not recommend this.  They stated that there are some holistic clinics that we will do a broad range of testing that sometimes look at these but there is not standard evidence that checking these type of things help plus also they state this is just not standard to check so therefore I would not recommend [place] trying to do additional blood work (if patient is interested in doing those type of things-[facility] in [place] does this but there is not overwhelming evidence that this helps a person feel better or function better)

## 2022-11-03 NOTE — Telephone Encounter (Signed)
See MyChart message

## 2022-11-07 ENCOUNTER — Other Ambulatory Visit (HOSPITAL_COMMUNITY): Payer: Self-pay | Admitting: Family Medicine

## 2022-11-07 DIAGNOSIS — Z1231 Encounter for screening mammogram for malignant neoplasm of breast: Secondary | ICD-10-CM

## 2022-11-22 ENCOUNTER — Ambulatory Visit: Payer: 59 | Admitting: Family Medicine

## 2022-11-22 ENCOUNTER — Telehealth: Payer: Self-pay

## 2022-11-22 NOTE — Telephone Encounter (Signed)
Called and spoke with patient and advised per Dr Lorin Picket: I would recommend lipid, CMP, CBC Screening lipid, hot flashes   We typically do not check estrogen or hormone levels-please let the patient know that I did connect with gynecologist to see if they recommend this type of testing.  They state that they do not recommend this.  They stated that there are some holistic clinics that we will do a broad range of testing that sometimes look at these but there is not standard evidence that checking these type of things help plus also they state this is just not standard to check so therefore I would not recommend [place] trying to do additional blood work (if patient is interested in doing those type of things-[facility] in [place] does this but there is not overwhelming evidence that this helps a person feel better or function better)  Your labs have been ordered. If you have any questions or concerns, patient verbalized understanding.

## 2022-11-24 ENCOUNTER — Ambulatory Visit (HOSPITAL_COMMUNITY)
Admission: RE | Admit: 2022-11-24 | Discharge: 2022-11-24 | Disposition: A | Discharge status: Home / Self Care | Payer: 59 | Source: Ambulatory Visit | Attending: Family Medicine | Admitting: Family Medicine

## 2022-11-24 DIAGNOSIS — E7849 Other hyperlipidemia: Secondary | ICD-10-CM | POA: Diagnosis not present

## 2022-11-24 DIAGNOSIS — N951 Menopausal and female climacteric states: Secondary | ICD-10-CM | POA: Diagnosis not present

## 2022-11-24 DIAGNOSIS — Z1231 Encounter for screening mammogram for malignant neoplasm of breast: Secondary | ICD-10-CM | POA: Diagnosis not present

## 2022-11-25 LAB — CBC WITH DIFFERENTIAL/PLATELET
Immature Granulocytes: 0 %
RBC: 4.04 x10E6/uL (ref 3.77–5.28)

## 2022-12-06 ENCOUNTER — Ambulatory Visit: Payer: 59 | Admitting: Family Medicine

## 2022-12-06 ENCOUNTER — Encounter: Payer: Self-pay | Admitting: Family Medicine

## 2022-12-06 VITALS — BP 108/58 | HR 63 | Temp 97.9°F | Ht 66.0 in | Wt 134.0 lb

## 2022-12-06 DIAGNOSIS — N951 Menopausal and female climacteric states: Secondary | ICD-10-CM

## 2022-12-06 DIAGNOSIS — H6122 Impacted cerumen, left ear: Secondary | ICD-10-CM | POA: Diagnosis not present

## 2022-12-06 DIAGNOSIS — F439 Reaction to severe stress, unspecified: Secondary | ICD-10-CM

## 2022-12-06 DIAGNOSIS — E7849 Other hyperlipidemia: Secondary | ICD-10-CM | POA: Diagnosis not present

## 2022-12-06 MED ORDER — SERTRALINE HCL 50 MG PO TABS: 50.0000 mg | ORAL_TABLET | Freq: Every day | ORAL | 3 refills | Status: DC

## 2022-12-06 NOTE — Progress Notes (Signed)
   Subjective:    Patient ID: Rebekah Andrews, female    DOB: 01/19/1966, 57 y.o.   MRN: [id number]  HPI 6 month follow up concerns with menopausal symptoms Patient relates that she has a lot of hot flashes Finds himself feeling warm at nighttime.  Has to have a fan on.  In addition to this having symptoms of irritability.  Denies being depressed.  Takes care of her elderly mother who has circulation issues Elderly mother in an assisted living unlikely to go home but that is a point of contention Also having to deal with being executor of a recent death in the family She does have family history of breast cancer with her mother as well as some aunts.  Patient has never had genetic testing. She also relates some times of being irritable with increased anxiety as well as palpitations at times Sleep is not consistent  Patient did not feel the estrogens or Prozac was doing her any good she stopped the several months ago she has not noticed any difference Anxiety stress -check hormones   Review of Systems     Objective:   Physical Exam The 10-year ASCVD risk score (Arnett DK, et al., 2019) is: 1.9%   Values used to calculate the score:     Age: 66 years     Sex: Female     Is Non-Hispanic African American: No     Diabetic: No     Tobacco smoker: No     Systolic Blood Pressure: 108 mmHg     Is BP treated: No     HDL Cholesterol: 72 mg/dL     Total Cholesterol: 218 mg/dL  Recent labs reviewed General-in no acute distress Eyes-no discharge Lungs-respiratory rate normal, CTA CV-no murmurs,RRR Extremities skin warm dry no edema Neuro grossly normal Behavior normal, alert Cerumen impaction left side      Assessment & Plan:  1. Other hyperlipidemia Mild hyperlipidemia-strong family history of heart disease-will do coronary calcium score - CT CARDIAC SCORING (SELF PAY ONLY)  2. Hot flash, menopausal Menopausal symptoms Veozah would be beneficial but not covered by her  insurance With her increased risk of breast cancer hold off on estrogens currently Selective serotonin reuptake inhibitors would be beneficial potentially we will go ahead and start sertraline 50 mg daily patient to give [place] feedback in 3 to 4 weeks more than likely go up to 100 mg daily at that point Hold off on estrogens 3. Stress Stress levels serotonin reuptake inhibitor would help - CT CARDIAC SCORING (SELF PAY ONLY)  4. Impacted cerumen of left ear Referral to ENT - Ambulatory referral to ENT  Patient to do a follow-up visit in approximately 3 months to give [place] MyChart update in approximately 3 to 4 weeks

## 2022-12-06 NOTE — Progress Notes (Deleted)
   Subjective:    Patient ID: Rebekah Andrews, female    DOB: [birth year], 57 y.o.   MRN: [id number]  HPI The patient comes in today for a wellness visit.    A review of their health history was completed.  A review of medications was also completed.  Any needed refills; ***  Eating habits: ***  Falls/  MVA accidents in past few months: ***  Regular exercise: ***  Specialist pt sees on regular basis: ***  Preventative health issues were discussed.   Additional concerns: ***    Review of Systems     Objective:   Physical Exam        Assessment & Plan:

## 2023-01-13 ENCOUNTER — Ambulatory Visit (HOSPITAL_COMMUNITY)
Admission: RE | Admit: 2023-01-13 | Discharge: 2023-01-13 | Disposition: A | Discharge status: Home / Self Care | Payer: Self-pay | Source: Ambulatory Visit | Attending: Family Medicine | Admitting: Family Medicine

## 2023-01-13 DIAGNOSIS — F439 Reaction to severe stress, unspecified: Secondary | ICD-10-CM | POA: Insufficient documentation

## 2023-01-13 DIAGNOSIS — E7849 Other hyperlipidemia: Secondary | ICD-10-CM | POA: Insufficient documentation

## 2023-01-18 ENCOUNTER — Other Ambulatory Visit: Payer: Self-pay

## 2023-01-18 DIAGNOSIS — Z79899 Other long term (current) drug therapy: Secondary | ICD-10-CM

## 2023-01-18 DIAGNOSIS — E7849 Other hyperlipidemia: Secondary | ICD-10-CM

## 2023-01-18 MED ORDER — ROSUVASTATIN CALCIUM 10 MG PO TABS: 10.0000 mg | ORAL_TABLET | Freq: Every day | ORAL | 1 refills | Status: DC

## 2023-01-24 ENCOUNTER — Institutional Professional Consult (permissible substitution) (INDEPENDENT_AMBULATORY_CARE_PROVIDER_SITE_OTHER): Payer: 59

## 2023-03-05 ENCOUNTER — Other Ambulatory Visit: Payer: Self-pay | Admitting: Family Medicine

## 2023-03-08 ENCOUNTER — Ambulatory Visit: Payer: 59 | Admitting: Family Medicine

## 2023-04-25 ENCOUNTER — Ambulatory Visit (INDEPENDENT_AMBULATORY_CARE_PROVIDER_SITE_OTHER): Payer: 59 | Admitting: Family Medicine

## 2023-04-25 VITALS — BP 110/72 | HR 64 | Temp 98.4°F | Ht 66.0 in | Wt 140.8 lb

## 2023-04-25 DIAGNOSIS — J019 Acute sinusitis, unspecified: Secondary | ICD-10-CM

## 2023-04-25 DIAGNOSIS — E7849 Other hyperlipidemia: Secondary | ICD-10-CM | POA: Diagnosis not present

## 2023-04-25 MED ORDER — ROSUVASTATIN CALCIUM 10 MG PO TABS
10.0000 mg | ORAL_TABLET | Freq: Every day | ORAL | 1 refills | Status: DC
Start: 2023-04-25 — End: 2023-05-15

## 2023-04-25 MED ORDER — DOXYCYCLINE HYCLATE 100 MG PO TABS: 100.0000 mg | ORAL_TABLET | Freq: Two times a day (BID) | ORAL | 0 refills | Status: DC

## 2023-04-25 NOTE — Progress Notes (Signed)
   Subjective:    Patient ID: Rebekah Andrews, female    DOB: 1965/12/06, 58 y.o.   MRN: [id number]  HPI  Patient presents today with respiratory illness Number of days present-14  Symptoms include-started off with head congestion drainage coughing after being exposed to RSV now with more head congestion sinus pressure discolored drainage denies wheezing difficulty breathing no wheezing currently  Presence of worrisome signs (severe shortness of breath, lethargy, etc.) -no wheezing shortness of breath or high fevers no sweats  Recent/current visit to urgent care or ER-none  Recent direct exposure to Covid-none but was exposed to RSV  Any current Covid testing-none    Review of Systems     Objective:   Physical Exam Gen-NAD not toxic TMS-normal bilateral T- normal no redness Chest-CTA respiratory rate normal no crackles CV RRR no murmur Skin-warm dry Neuro-grossly normal        Assessment & Plan:   1. Other hyperlipidemia Patient originally states she did not get the prescription we did discuss statins more patient at this point in time would like to go ahead with her medication she will check lipid liver in approximately 8 to 10 weeks - rosuvastatin  (CRESTOR ) 10 MG tablet; Take 1 tablet (10 mg total) by mouth daily.  Dispense: 90 tablet; Refill: 1 As for cholesterol and the main reason to treat this is elevated coronary calcium  and increased risk for heart disease as she gets older she is aware of this 2. Acute rhinosinusitis (Primary) Antibiotics prescribed warning signs discussed follow-up if progressive troubles-I feel that she started off with a viral illness and this opened up the door for secondary rhinosinusitis

## 2023-04-28 ENCOUNTER — Other Ambulatory Visit: Payer: Self-pay | Admitting: Family Medicine

## 2023-04-28 ENCOUNTER — Telehealth: Payer: Self-pay | Admitting: *Deleted

## 2023-04-28 ENCOUNTER — Other Ambulatory Visit: Payer: Self-pay

## 2023-04-28 DIAGNOSIS — E7849 Other hyperlipidemia: Secondary | ICD-10-CM

## 2023-04-28 DIAGNOSIS — Z79899 Other long term (current) drug therapy: Secondary | ICD-10-CM

## 2023-04-28 MED ORDER — ATORVASTATIN CALCIUM 20 MG PO TABS: 20.0000 mg | ORAL_TABLET | Freq: Every day | ORAL | 5 refills | Status: DC

## 2023-04-28 NOTE — Telephone Encounter (Signed)
PA for   rosuvastatin (CRESTOR) 10 MG tablet   Medication is non preferred on plan- preferred medication is atorvastatin

## 2023-04-28 NOTE — Telephone Encounter (Signed)
Called pt and went over dr Gerda Diss recommendation and comments on medicaition and labs, pt verbalized understanding.

## 2023-04-28 NOTE — Telephone Encounter (Signed)
Nurses It is apparent that her insurance company has preferences In this situation I am perfectly fine with atorvastatin 20 mg 1 a day, #30 with 5 refills Repeat lipid, liver in approximately 8 to 10 weeks Please talk with patient get patient on board 20 mg of atorvastatin is equal to 10 mg of rosuvastatin Both of these are statins that come in generic but her insurance company favors atorvastatin thank you

## 2023-04-28 NOTE — Telephone Encounter (Signed)
Copied from CRM [id number]. Topic: Clinical - Medication Refill >> Apr 28, 2023  1:38 PM Louie Casa B wrote: Most Recent Primary Care Visit:  Provider: Lilyan Punt A  Department: [facility]  Visit Type: ACUTE  Date: 04/25/2023  Medication: ***  Has the patient contacted their pharmacy? Yes (Agent: If no, request that the patient contact the pharmacy for the refill. If patient does not wish to contact the pharmacy document the reason why and proceed with request.) (Agent: If yes, when and what did the pharmacy advise?)  Is this the correct pharmacy for this prescription?  If no, delete pharmacy and type the correct one.  This is the patient's preferred pharmacy:    [id number] [place] [place] - [address] [facility] [address] [place] [address] Phone: [phone] Fax: [phone]   Has the prescription been filled recently? No  Is the patient out of the medication? Yes  Has the patient been seen for an appointment in the last year OR does the patient have an upcoming appointment?   Can we respond through MyChart? Yes  Agent: Please be advised that Rx refills may take up to 3 business days. We ask that you follow-up with your pharmacy.

## 2023-05-01 ENCOUNTER — Encounter: Payer: Self-pay | Admitting: Family Medicine

## 2023-05-01 ENCOUNTER — Other Ambulatory Visit: Payer: Self-pay | Admitting: Nurse Practitioner

## 2023-05-01 MED ORDER — CEFDINIR 300 MG PO CAPS
300.0000 mg | ORAL_CAPSULE | Freq: Two times a day (BID) | ORAL | 0 refills | Status: AC
Start: 2023-05-01 — End: ?

## 2023-05-13 ENCOUNTER — Encounter: Payer: Self-pay | Admitting: Family Medicine

## 2023-05-15 ENCOUNTER — Other Ambulatory Visit: Payer: Self-pay | Admitting: Family Medicine

## 2023-05-15 MED ORDER — ATORVASTATIN CALCIUM 20 MG PO TABS: 20.0000 mg | ORAL_TABLET | Freq: Every day | ORAL | 1 refills | Status: DC

## 2023-06-12 ENCOUNTER — Other Ambulatory Visit: Payer: Self-pay | Admitting: Family Medicine

## 2023-06-12 ENCOUNTER — Encounter: Payer: Self-pay | Admitting: Family Medicine

## 2023-06-12 MED ORDER — VALACYCLOVIR HCL 1 G PO TABS: ORAL_TABLET | ORAL | 5 refills | Status: AC

## 2023-06-12 MED ORDER — VALACYCLOVIR HCL 500 MG PO TABS: ORAL_TABLET | ORAL | 12 refills | Status: AC

## 2023-07-12 ENCOUNTER — Encounter: Payer: Self-pay | Admitting: Family Medicine

## 2023-07-12 ENCOUNTER — Other Ambulatory Visit: Payer: Self-pay | Admitting: Family Medicine

## 2023-07-12 MED ORDER — SERTRALINE HCL 100 MG PO TABS: 100.0000 mg | ORAL_TABLET | Freq: Every day | ORAL | 1 refills | Status: DC

## 2023-11-11 ENCOUNTER — Other Ambulatory Visit: Payer: Self-pay | Admitting: Family Medicine

## 2024-01-06 ENCOUNTER — Other Ambulatory Visit: Payer: Self-pay | Admitting: Family Medicine

## 2024-04-13 ENCOUNTER — Other Ambulatory Visit: Payer: Self-pay | Admitting: Family Medicine

## 2024-05-17 ENCOUNTER — Other Ambulatory Visit: Payer: Self-pay | Admitting: Family Medicine
